# Patient Record
Sex: Male | Born: 1965 | Race: White | Hispanic: No | Marital: Married | Smoking: Never smoker
Health system: Southern US, Community
[De-identification: ages and names within clinical notes are randomized; demographics above are authoritative.]

## PROBLEM LIST (undated history)

## (undated) DIAGNOSIS — E875 Hyperkalemia: Secondary | ICD-10-CM

## (undated) DIAGNOSIS — R079 Chest pain, unspecified: Secondary | ICD-10-CM

## (undated) DIAGNOSIS — M519 Unspecified thoracic, thoracolumbar and lumbosacral intervertebral disc disorder: Secondary | ICD-10-CM

## (undated) DIAGNOSIS — I10 Essential (primary) hypertension: Secondary | ICD-10-CM

## (undated) DIAGNOSIS — I959 Hypotension, unspecified: Secondary | ICD-10-CM

## (undated) DIAGNOSIS — N289 Disorder of kidney and ureter, unspecified: Secondary | ICD-10-CM

## (undated) DIAGNOSIS — N179 Acute kidney failure, unspecified: Secondary | ICD-10-CM

## (undated) DIAGNOSIS — M5020 Other cervical disc displacement, unspecified cervical region: Secondary | ICD-10-CM

## (undated) DIAGNOSIS — E119 Type 2 diabetes mellitus without complications: Secondary | ICD-10-CM

## (undated) DIAGNOSIS — R0602 Shortness of breath: Secondary | ICD-10-CM

## (undated) DIAGNOSIS — N170 Acute kidney failure with tubular necrosis: Secondary | ICD-10-CM

## (undated) DIAGNOSIS — R000 Tachycardia, unspecified: Secondary | ICD-10-CM

## (undated) DIAGNOSIS — G4730 Sleep apnea, unspecified: Secondary | ICD-10-CM

## (undated) HISTORY — DX: Shortness of breath: R06.02

## (undated) HISTORY — PX: SHOULDER SURGERY: SHX246

## (undated) HISTORY — DX: Type 2 diabetes mellitus without complications: E11.9

## (undated) HISTORY — DX: Hypotension, unspecified: I95.9

## (undated) HISTORY — DX: Hyperkalemia: E87.5

## (undated) HISTORY — DX: Essential (primary) hypertension: I10

## (undated) HISTORY — DX: Tachycardia, unspecified: R00.0

## (undated) HISTORY — DX: Acute kidney failure with tubular necrosis: N17.0

## (undated) HISTORY — DX: Acute kidney failure, unspecified: N17.9

## (undated) HISTORY — DX: Disorder of kidney and ureter, unspecified: N28.9

## (undated) HISTORY — PX: KNEE SURGERY: SHX244

## (undated) HISTORY — DX: Chest pain, unspecified: R07.9

---

## 1997-10-29 ENCOUNTER — Emergency Department (HOSPITAL_COMMUNITY): Admission: EM | Admit: 1997-10-29 | Discharge: 1997-10-29 | Payer: Self-pay | Admitting: Emergency Medicine

## 1997-10-30 ENCOUNTER — Emergency Department (HOSPITAL_COMMUNITY): Admission: EM | Admit: 1997-10-30 | Discharge: 1997-10-30 | Payer: Self-pay | Admitting: Emergency Medicine

## 1999-11-15 ENCOUNTER — Emergency Department (HOSPITAL_COMMUNITY): Admission: EM | Admit: 1999-11-15 | Discharge: 1999-11-15 | Payer: Self-pay | Admitting: Emergency Medicine

## 1999-11-15 ENCOUNTER — Encounter: Payer: Self-pay | Admitting: Emergency Medicine

## 1999-11-19 ENCOUNTER — Encounter: Admission: RE | Admit: 1999-11-19 | Discharge: 1999-11-19 | Payer: Self-pay | Admitting: Internal Medicine

## 1999-11-29 ENCOUNTER — Encounter: Admission: RE | Admit: 1999-11-29 | Discharge: 2000-02-27 | Payer: Self-pay | Admitting: *Deleted

## 2000-01-21 ENCOUNTER — Encounter: Admission: RE | Admit: 2000-01-21 | Discharge: 2000-01-21 | Payer: Self-pay | Admitting: Internal Medicine

## 2001-02-15 ENCOUNTER — Encounter: Admission: RE | Admit: 2001-02-15 | Discharge: 2001-02-15 | Payer: Self-pay | Admitting: Family Medicine

## 2001-02-15 ENCOUNTER — Encounter: Payer: Self-pay | Admitting: Family Medicine

## 2001-02-27 ENCOUNTER — Ambulatory Visit (HOSPITAL_COMMUNITY): Admission: RE | Admit: 2001-02-27 | Discharge: 2001-02-27 | Payer: Self-pay | Admitting: Orthopedic Surgery

## 2001-02-27 ENCOUNTER — Encounter: Payer: Self-pay | Admitting: Orthopedic Surgery

## 2001-10-15 ENCOUNTER — Ambulatory Visit (HOSPITAL_COMMUNITY): Admission: RE | Admit: 2001-10-15 | Discharge: 2001-10-15 | Payer: Self-pay | Admitting: Orthopedic Surgery

## 2001-10-15 ENCOUNTER — Encounter: Payer: Self-pay | Admitting: Orthopedic Surgery

## 2001-11-29 ENCOUNTER — Observation Stay (HOSPITAL_COMMUNITY): Admission: RE | Admit: 2001-11-29 | Discharge: 2001-11-30 | Payer: Self-pay | Admitting: Orthopedic Surgery

## 2002-04-30 ENCOUNTER — Ambulatory Visit (HOSPITAL_COMMUNITY): Admission: RE | Admit: 2002-04-30 | Discharge: 2002-04-30 | Payer: Self-pay | Admitting: *Deleted

## 2002-04-30 ENCOUNTER — Encounter: Payer: Self-pay | Admitting: *Deleted

## 2003-03-07 ENCOUNTER — Ambulatory Visit (HOSPITAL_BASED_OUTPATIENT_CLINIC_OR_DEPARTMENT_OTHER): Admission: RE | Admit: 2003-03-07 | Discharge: 2003-03-07 | Payer: Self-pay | Admitting: Orthopedic Surgery

## 2003-08-29 ENCOUNTER — Encounter: Admission: RE | Admit: 2003-08-29 | Discharge: 2003-11-27 | Payer: Self-pay | Admitting: Internal Medicine

## 2004-02-25 ENCOUNTER — Encounter: Admission: RE | Admit: 2004-02-25 | Discharge: 2004-02-25 | Payer: Self-pay | Admitting: Internal Medicine

## 2005-04-20 ENCOUNTER — Encounter: Admission: RE | Admit: 2005-04-20 | Discharge: 2005-04-20 | Payer: Self-pay | Admitting: Internal Medicine

## 2005-12-12 ENCOUNTER — Encounter: Admission: RE | Admit: 2005-12-12 | Discharge: 2005-12-12 | Payer: Self-pay | Admitting: Rheumatology

## 2006-01-02 ENCOUNTER — Ambulatory Visit (HOSPITAL_BASED_OUTPATIENT_CLINIC_OR_DEPARTMENT_OTHER): Admission: RE | Admit: 2006-01-02 | Discharge: 2006-01-02 | Payer: Self-pay | Admitting: Rheumatology

## 2006-01-08 ENCOUNTER — Ambulatory Visit: Payer: Self-pay | Admitting: Internal Medicine

## 2006-06-30 ENCOUNTER — Encounter: Admission: RE | Admit: 2006-06-30 | Discharge: 2006-06-30 | Payer: Self-pay | Admitting: Internal Medicine

## 2006-12-25 ENCOUNTER — Encounter: Admission: RE | Admit: 2006-12-25 | Discharge: 2006-12-25 | Payer: Self-pay | Admitting: Internal Medicine

## 2009-03-07 ENCOUNTER — Inpatient Hospital Stay (HOSPITAL_COMMUNITY): Admission: EM | Admit: 2009-03-07 | Discharge: 2009-03-09 | Payer: Self-pay | Admitting: Emergency Medicine

## 2010-10-16 LAB — WOUND CULTURE

## 2010-10-16 LAB — BASIC METABOLIC PANEL
Calcium: 8.7 mg/dL (ref 8.4–10.5)
Creatinine, Ser: 0.63 mg/dL (ref 0.4–1.5)
GFR calc Af Amer: >60 mL/min (ref >60)
GFR calc non Af Amer: >60 mL/min (ref >60)
Glucose, Bld: 178 mg/dL — ABNORMAL HIGH (ref 70–99)
Sodium: 136 mEq/L (ref 135–145)

## 2010-10-16 LAB — COMPREHENSIVE METABOLIC PANEL
ALT: 21 U/L (ref 0–53)
AST: 19 U/L (ref 0–37)
Alkaline Phosphatase: 54 U/L (ref 39–117)
CO2: 27 mEq/L (ref 19–32)
Calcium: 8.9 mg/dL (ref 8.4–10.5)
Chloride: 105 mEq/L (ref 96–112)
GFR calc Af Amer: >60 mL/min (ref >60)
GFR calc non Af Amer: >60 mL/min (ref >60)
Glucose, Bld: 141 mg/dL — ABNORMAL HIGH (ref 70–99)
Potassium: 4.2 mEq/L (ref 3.5–5.1)
Sodium: 140 mEq/L (ref 135–145)

## 2010-10-16 LAB — GLUCOSE, CAPILLARY
Glucose-Capillary: 144 mg/dL — ABNORMAL HIGH (ref 70–99)
Glucose-Capillary: 175 mg/dL — ABNORMAL HIGH (ref 70–99)
Glucose-Capillary: 175 mg/dL — ABNORMAL HIGH (ref 70–99)
Glucose-Capillary: 192 mg/dL — ABNORMAL HIGH (ref 70–99)
Glucose-Capillary: 213 mg/dL — ABNORMAL HIGH (ref 70–99)

## 2010-10-16 LAB — PROTIME-INR: Prothrombin Time: 13.3 seconds (ref 11.6–15.2)

## 2010-10-16 LAB — CBC
Hemoglobin: 12.7 g/dL — ABNORMAL LOW (ref 13.0–17.0)
Hemoglobin: 12.9 g/dL — ABNORMAL LOW (ref 13.0–17.0)
RBC: 4.07 MIL/uL — ABNORMAL LOW (ref 4.22–5.81)
RBC: 4.21 MIL/uL — ABNORMAL LOW (ref 4.22–5.81)
RDW: 13 % (ref 11.5–15.5)
WBC: 5.8 10*3/uL (ref 4.0–10.5)

## 2010-10-16 LAB — HEMOGLOBIN A1C: Hgb A1c MFr Bld: 8.4 % — ABNORMAL HIGH (ref 4.6–6.1)

## 2010-10-16 LAB — LIPID PANEL
LDL Cholesterol: 46 mg/dL (ref 0–99)
Total CHOL/HDL Ratio: 2.4 RATIO
VLDL: 16 mg/dL (ref 0–40)

## 2010-10-16 LAB — DIFFERENTIAL
Basophils Absolute: 0 10*3/uL (ref 0.0–0.1)
Basophils Relative: 0 % (ref 0–1)
Lymphocytes Relative: 18 % (ref 12–46)
Monocytes Absolute: 0.6 10*3/uL (ref 0.1–1.0)
Neutro Abs: 6.1 10*3/uL (ref 1.7–7.7)
Neutrophils Relative %: 73 % (ref 43–77)

## 2010-10-16 LAB — APTT: aPTT: 33 seconds (ref 24–37)

## 2010-11-23 NOTE — H&P (Signed)
Scott Simmons, Scott Simmons                ACCOUNT NO.:  [id number]   MEDICAL RECORD NO.:  [id number]          PATIENT TYPE:  INP   LOCATION:  1826                         FACILITY:  [facility]   PHYSICIAN:  Michiel Cowboy, MDDATE OF BIRTH:  1966-02-19   DATE OF ADMISSION:  03/07/2009  DATE OF DISCHARGE:                              HISTORY & PHYSICAL   PRIMARY CARE PHYSICIAN:  Georgann Housekeeper, MD.   CHIEF COMPLAINT:  Swollen right arm.   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  He has a  lot of superficial skin injuries throughout his work.  Yesterday he had  a small injury to his right wrist and then noticed to have some redness  and swelling around the area.  He presented to urgent care who started  him on doxycycline and wanted him to come back again.  When he returned  today his arm seemed to be a little bit more swollen, with some more  spreading redness at which point they recommended for him to follow up  at the emergency department.  ER felt that given that this is a diabetic  with upper extremity cellulitis that admission was warranted at which  point [facility] was called for an admission.  The patient denies  any fevers or chills which he could measure but felt that maybe  yesterday he may have had some fever.  He had a little bit of a  headache.  No nausea, no vomiting, no diarrhea, no diaphoresis.  No  chest pain, no shortness of breath.   REVIEW OF SYSTEMS:  Otherwise review of systems unremarkable.   PAST MEDICAL HISTORY:  Significant for diabetes, arthritis, chronic back  pain.  His last hemoglobin A1c was 7.0.   SOCIAL HISTORY:  The patient does not smoke or drink, does not abuse  drugs.  Works as a Curator.   FAMILY HISTORY:  Significant for father with diabetes.   ALLERGIES:  None.   MEDICATIONS:  1. Oxycodone 30 mg as needed.  The patient uses it up to 4-6 times a      day.  2. Actos / metformin 850 / 50 mg twice a day.  3.  Glipizide 5 mg daily.  4. Lisinopril 2.5 daily.  5. Gemfibrozil dose unknown daily.  6. Celebrex 200 twice a day as needed.  7. Soma 350 as needed.  Usually uses it about twice a day.   PHYSICAL EXAMINATION:  VITAL SIGNS:  Temperature 98.2, blood pressure  149/90, pulse 99, respirations 27, satting 99% on room air.  GENERAL:  The patient appears to be currently in no acute distress.  HEAD:  Nontraumatic.  Moist mucous membranes.  LUNGS:  Clear to auscultation bilaterally.  HEART:  Regular rate and rhythm.  No murmurs appreciated.  ABDOMEN:  Soft, nontender, nondistended, slightly obese.  LOWER EXTREMITIES:  Without clubbing, cyanosis or edema.  NEUROLOGICAL:  The patient is grossly intact.  Upper extremity, right  upper extremity significant for small draining serosanguineous fluid /  lesion surrounded by erythema and mild induration, no fluctuance noted.   LABS:  White blood cell count 8.4, hemoglobin 12.9, sodium 136,  potassium 3.8, creatinine 0.63.   ASSESSMENT AND PLAN:  1. This is a 45 year old diabetic with upper extremity cellulitis.      Will admit for IV antibiotics.  He already has been started on      Zosyn and vanc in ED.  We will continue that.  Try to obtain wound      cultures.  Since the patient is afebrile for right now will hold      off on blood cultures but if he becomes febrile will order.  The      patient only had an infection for 2 days.  Will hold off on      radiological imaging since osteomyelitis at this point would be      extremely unlikely.  2. Diabetes.  Continue home medications.  Check hemoglobin A1c and do      sliding scale.  Hold metformin while inpatient.  3. Chronic pain.  Continue oxycodone and Soma.  4. Prophylaxis.  Protonix and Lovenox.   Dr. Georgann Housekeeper to assume care in the a.m.      Michiel Cowboy, MD  Electronically Signed     AVD/MEDQ  D:  03/07/2009  T:  03/07/2009  Job:  [id number]

## 2010-11-23 NOTE — Discharge Summary (Signed)
Scott Simmons, Scott Simmons                ACCOUNT NO.:  [id number]   MEDICAL RECORD NO.:  [id number]          PATIENT TYPE:  INP   LOCATION:  5127                         FACILITY:  [facility]   PHYSICIAN:  Georgann Housekeeper, MD      DATE OF BIRTH:  03/27/1966   DATE OF ADMISSION:  03/07/2009  DATE OF DISCHARGE:  03/09/2009                               DISCHARGE SUMMARY   DISCHARGE DIAGNOSES:  1. Right arm cellulitis.  2. History of diabetes.  3. History of chronic pain syndrome.  4. History of dyslipidemia.   MEDICATION ON DISCHARGE:  1. ACTOplus Met 850/15 twice a day.  2. Celebrex 200 mg b.i.d.  3. Lopid 600 mg daily.  4. Glipizide 5 mg b.i.d.  5. Lisinopril 2.5 mg daily.  6. Oxycodone 30 mg q.4h. p.r.n. for pain.  7. Aspirin 81 mg daily.  8. Soma 350 three times a day as needed.  9. Doxycycline 100 mg b.i.d. for 10 days.  10.Septra DS one b.i.d. for 10 days.   Home health to check on the right arm.   LABORATORY DATA:  White count of 5.8, hemoglobin 12.7.  Blood  chemistries normal.  LFTs normal.  Wound cultures done on March 07, 2009 and March 08, 2009, negative.  A1c 8.4.   Blood pressure 105/59, pulse 78, temperature 98.2.   HOSPITAL COURSE:  A 45 year old male with history of diabetes, chronic  pain and hypertension.  He had several cuts he gets on the hand from  being at work.  He got a little cut on the right arm and was seen in the  walk-in clinic on the Saturday.  His symptoms were not getting better  with p.o. antibiotic.  His redness got worse on the forearm and he had a  little bit of area of abscess on the wrist.  He was seen in the ER.  Because of diabetes, he was started on vancomycin and Zosyn.  His white  count remained normal.  He remained afebrile.  His wound culture were  negative.  He had I and D which there was not much drained.  His redness  of the arm over the next 2 days resolved.  He still had little bit of  swelling and mild discomfort.  The patient will  get p.o. antibiotic,  doxycycline and Septra and follow up with me at the office in 2-3 days.  We will have home health and to check on his wound.      Georgann Housekeeper, MD  Electronically Signed     KH/MEDQ  D:  03/09/2009  T:  03/09/2009  Job:  [id number]

## 2010-11-26 NOTE — Procedures (Signed)
NAME:  Scott Simmons, Scott Simmons                ACCOUNT NO.:  [id number]   MEDICAL RECORD NO.:  [id number]          PATIENT TYPE:  OUT   LOCATION:  SLEEP CENTER                 [facility]ACILITY[facility]   PHYSICIAN:  Clinton D. Maple Hudson, M.D. DATE OF BIRTH:  10/13/1965   DATE OF STUDY:  01/02/2006                              NOCTURNAL POLYSOMNOGRAM   REFERRING PHYSICIAN:  Dr. Stacey Drain.   INDICATIONS FOR STUDY:  Hypersomnia with sleep apnea.   EPWORTH SLEEPINESS SCORE:  08/24.   BMI:  41.6.   WEIGHT:  298 pounds.   HOME MEDICATION:  Metformin, Avandia, Mobic, Tricor, Ambien, glipizide,  Skelaxin, Hydrocodone, aspirin, Xanax.   SLEEP ARCHITECTURE:  Total sleep time 364 minutes with sleep efficiency 96%.  Stage I was 3%, stage II 82%, stages III and IV were absent, REM 15% of  total sleep time.  Sleep latency 5 minutes, REM latency 225 minutes, awake  after sleep onset 10 minutes, arousal index 18.8.   RESPIRATORY DATA:  Split study protocol.  Apnea/hypopnea index (AHI, RDI)  52.8 obstructive events per hour indicating moderately severe obstructive  sleep apnea/hypopnea syndrome before CPAP.  This included 89 obstructive  apneas, one central apnea and 34 hypopneas before CPAP.  Events for more  common while supine but did occur in other positions.  REM AHI 0. CPAP was  titrated to 13 CWP, AHI 1.7 per hour.  A medium ComfortGel mask was used  with heated humidifier.   OXYGEN DATA:  Very loud snoring with oxygen desaturation to a nadir of 75%  before CPAP.  After CPAP control saturation held 95% on room air.   CARDIAC DATA:  Normal sinus rhythm.   MOVEMENT/PARASOMNIA:  A total of 138 limb jerks were recorded of which only  five were associated with awakening or arousal for periodic limb movement  with arousal index of 0.8 per hour which is insignificant.   IMPRESSION/RECOMMENDATIONS:  1.  Moderately severe obstructive sleep apnea/hypopnea syndrome, AHI 52.8      per hour with events most  common while supine.  Very loud snoring with      oxygen desaturation to a nadir of 75%.  2.  Successful CPAP titration to 13 CWP, AHI 1.7 per hour.  A medium      ComfortGel mask was used with heated humidifier.      Clinton D. Maple Hudson, M.D.  Diplomate, Biomedical engineer of Sleep Medicine  Electronically Signed     CDY/MEDQ  D:  01/08/2006 12:55:39  T:  01/09/2006 08:47:36  Job:  [id number]

## 2010-11-26 NOTE — Op Note (Signed)
[facility]  Patient:    Scott Simmons, Scott Simmons Visit Number: [id number] MRN: [id number]          Service Type: SUR Location: 4W 0460 02 Attending Physician:  Marlowe Kays Page Dictated by:   Illene Labrador. Aplington, M.D. Proc. Date: 11/29/01 Admit Date:  11/29/2001 Discharge Date: 11/30/2001                             Operative Report  PREOPERATIVE DIAGNOSES:  1. Labral disruption.  2. 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  POSTOPERATIVE DIAGNOSES:  1. Labral disruption.  2. 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  OPERATION PERFORMED:  1. Right shoulder arthroscopy with debridement of labral disruption.  2. Arthroscopic subacromial decompression.  SURGEON:  Illene Labrador. Aplington, M.D.  ASSISTANT:  Marcie Bal. Troncale, P.A.C.  ANESTHESIA:  General.  PATHOLOGY AND JUSTIFICATION FOR PROCEDURE:  Chronic pain, right shoulder, unrelieved by steroid injection is a type 3 acromion and a MRI has some interstitial disruption of the rotator cuff with chronic subacromial pain. He also has some minor labral disruption at the attachment of the biceps tendon area. These findings were confirmed at surgery.  DESCRIPTION OF PROCEDURE:  Satisfactory general anesthesia, beach chair position on the Schlein frame, the right shoulder girdle was prepped with Duraprep and draped in a sterile field and the anatomy of the shoulder was marked out and the subacromial space, lateral portal and the posterior soft spot portals were infiltrated with 0.5% Marcaine with adrenaline into the posterior soft spot. I introduced a blunt trocar in the glenohumeral joint and on inspection the joint was normal other than significant fraying of the labrum just adjacent to the biceps tendon. Because of this, I introduced the scope into the anterior capsule. I had to go medial to the biceps tendon since I was  unable to really get through in the usual position lateral and between that the subscapularis, he is a very large man. I introduced a 4.2 shaver through the anterior portal and was able to shave down the labral disruption with the basic labral integrity, it seemed to be intact as far as attachment. After taking documentary pictures pre and post and completing this portion of the procedure, I then removed the cannula after draining the anterior joint and redirected the scope into the subacromial area. I then made a lateral portal and introduced the cannula with a 4.2 shaver. He did not have a lot of subdeltoid bursitis but visualization, a small amount was removed. I then introduced the arthrocare vaporizer and began removing the soft tissue from the underneath surface of the acromion. He had a very thick coracoacromial ligament which appeared to be play a very prominent part in his impingement process. A documented picture of the preoperative tightness was taken. I then brought in a 4-0 oval bur and began burring down the acromion until we had a wide subacromial clearance which was documented with the arm to the side and arm abducted. Some minimal bleeding was corrected before completing the surgery. The subacromial space was drained as much as it could and the three portals were closed with interrupted 4-0 nylon mattress sutures. They were then infiltrated along the subacromial space with 0.5% Marcaine with adrenaline once again. Betadine Adaptic dry sterile dressing and large shoulder immobilizer were applied. He tolerated the procedure well and at the time of  this dictation was on his way to the recovery room in satisfactory condition with no known complications. Dictated by:   Illene Labrador. Aplington, M.D. Attending Physician:  Joaquin Courts DD:  11/29/01 TD:  12/02/01 Job: [id number]

## 2010-11-26 NOTE — Op Note (Signed)
NAME:  Scott Simmons, Scott Simmons                          ACCOUNT NO.:  [id number]   MEDICAL RECORD NO.:  [id number]                   PATIENT TYPE:  AMB   LOCATION:  DSC                                  FACILITY:  [facility]   PHYSICIAN:  Georges Lynch. Darrelyn Hillock, M.D.             DATE OF BIRTH:  08-26-1965   DATE OF PROCEDURE:  03/07/2003  DATE OF DISCHARGE:                                 OPERATIVE REPORT   SURGEON:  Georges Lynch. Darrelyn Hillock, M.D.   ASSISTANT:  Nurse.   PREOPERATIVE DIAGNOSES:  1. Partial tear of the anterior cruciate ligament, left knee.  2. Chronic synovitis, left knee.  3. Degenerative arthritis, left knee.   POSTOPERATIVE DIAGNOSES:  1. Partial tear of the anterior cruciate ligament, left knee.  2. Chronic synovitis, left knee.  3. Degenerative arthritis, left knee.   OPERATION:  1. Diagnostic arthroscopy, left knee.  2. Debridement of a large flap tear of  the ACL, left knee.  3. Synovectomy, left knee.   PROCEDURE:  Under general anesthesia routine orthopedic prepping and draping  of the left lower extremity was carried out. I made a small  punctate  incision in the suprapatellar pouch area. The inflow cannula was inserted  and the knee was  distended with saline. Following  this another small  punctate incision was made in the anterolateral joint  space. The  arthroscope was entered from this approach.   I did a complete diagnostic arthroscopy. He had rather significant  synovitis. I introduced the shaver suction device through the medial  approach and did a synovectomy. I then went down and looked at the ACL and  posterior cruciate. He had about a 50% tear of the ACL. There was  a large  cystic like area embedded in the ACL from a chronic degenerated portion of  the ACL. He also had a large flap tear which was then removed with the  shaver suction device as well as a cystic area. I probed the ACL. The  remaining part of the ACL was intact.   I then went over and examined  both menisci. They were intact, but he did  have some early arthritic changes of the lateral tibial plateau.   I thoroughly irrigated out the knee. I removed all the fluid. I closed all 3  punctate incisions with 3-0 nylon suture. Sterile dressings were applied  after I injected 30 mL of 0.5% Marcaine with epinephrine into the knee  joint.   Follow up care will be on Percocet 10/650 for pain, 1 q.4h. p.r.n. for pain.  He will be on Bufferin 1 b.i.d. x2 weeks as an anticoagulant. He will be on  crutches partial to full weightbearing. I will see  him in the office in  about 2 weeks or prior to that if there is a problem.  Ronald A. Darrelyn Hillock, M.D.    RAG/MEDQ  D:  03/07/2003  T:  03/08/2003  Job:  [id number]

## 2011-03-23 ENCOUNTER — Other Ambulatory Visit: Payer: Self-pay | Admitting: Internal Medicine

## 2011-03-23 DIAGNOSIS — R1013 Epigastric pain: Secondary | ICD-10-CM

## 2011-03-25 ENCOUNTER — Ambulatory Visit
Admission: RE | Admit: 2011-03-25 | Discharge: 2011-03-25 | Disposition: A | Discharge status: Home / Self Care | Payer: Managed Care, Other (non HMO) | Source: Ambulatory Visit | Attending: Internal Medicine | Admitting: Internal Medicine

## 2011-03-25 DIAGNOSIS — R1013 Epigastric pain: Secondary | ICD-10-CM

## 2011-03-31 ENCOUNTER — Ambulatory Visit (HOSPITAL_COMMUNITY)
Admission: RE | Admit: 2011-03-31 | Discharge: 2011-03-31 | Disposition: A | Discharge status: Home / Self Care | Payer: Managed Care, Other (non HMO) | Source: Ambulatory Visit | Attending: Gastroenterology | Admitting: Gastroenterology

## 2011-03-31 ENCOUNTER — Other Ambulatory Visit: Payer: Self-pay | Admitting: Gastroenterology

## 2011-03-31 DIAGNOSIS — R933 Abnormal findings on diagnostic imaging of other parts of digestive tract: Secondary | ICD-10-CM | POA: Insufficient documentation

## 2011-03-31 DIAGNOSIS — K319 Disease of stomach and duodenum, unspecified: Secondary | ICD-10-CM | POA: Insufficient documentation

## 2011-03-31 DIAGNOSIS — R110 Nausea: Secondary | ICD-10-CM | POA: Insufficient documentation

## 2011-04-10 NOTE — Op Note (Signed)
  NAME:  KUTTER, SCHNEPF NO.:  [id number]  MEDICAL RECORD NO.:  [id number]  LOCATION:  WLEN                         FACILITY:  [facility]  PHYSICIAN:  Danise Edge, M.D.   DATE OF BIRTH:  1966/06/04  DATE OF PROCEDURE:  03/31/2011 DATE OF DISCHARGE:                              OPERATIVE REPORT   REFERRING PHYSICIAN:  Georgann Housekeeper, M.D.  PROCEDURE:  Esophagogastroduodenoscopy.  HISTORY:  Mr. Scott Simmons is a 45 year old male, born on 1965-12-10.  The patient has unexplained intermittent nausea.  He underwent a barium esophagram with upper GI x-ray series, which revealed a filling defect in the distal esophagus.  ENDOSCOPIST:  Danise Edge, M.D.  PREMEDICATION: 1. Fentanyl 100 mcg. 2. Versed 10 mg. 3. Benadryl 50 mg.  PROCEDURE IN DETAIL:  The patient was placed in the left lateral decubitus position.  The Pentax gastroscope was passed through the posterior hypopharynx into the proximal esophagus without difficulty. The hypopharynx, larynx, and vocal cords appeared normal.  Esophagoscopy:  The proximal mid and lower segments of the esophageal mucosa appeared normal.  Gastroscopy:  There is a large polypoid lesion in the gastric cardia. Retroflex view of the gastric cardia and fundus was otherwise normal. The gastric body, antrum, and pylorus appeared normal.  Duodenoscopy:  The duodenal bulb and descending duodenum appeared normal.  Gastric biopsies were performed to look for H pylori gastritis.  Due to the patient's combativeness and continued retching, I was unable to biopsy the large gastric cardia polypoid lesion.  PLAN:  I will schedule the patient for a repeat esophagogastroduodenoscopy using propofol sedation.          ______________________________ Danise Edge, M.D.     MJ/MEDQ  D:  03/31/2011  T:  04/01/2011  Job:  [id number]  Electronically Signed by Danise Edge M.D. on 04/10/2011 01:44:22 PM

## 2011-04-21 ENCOUNTER — Other Ambulatory Visit: Payer: Self-pay | Admitting: Gastroenterology

## 2011-04-21 ENCOUNTER — Ambulatory Visit (HOSPITAL_COMMUNITY)
Admission: RE | Admit: 2011-04-21 | Discharge: 2011-04-21 | Disposition: A | Discharge status: Home / Self Care | Payer: Managed Care, Other (non HMO) | Source: Ambulatory Visit | Attending: Gastroenterology | Admitting: Gastroenterology

## 2011-04-21 DIAGNOSIS — Z79899 Other long term (current) drug therapy: Secondary | ICD-10-CM | POA: Insufficient documentation

## 2011-04-21 DIAGNOSIS — D131 Benign neoplasm of stomach: Secondary | ICD-10-CM | POA: Insufficient documentation

## 2011-04-21 DIAGNOSIS — E669 Obesity, unspecified: Secondary | ICD-10-CM | POA: Insufficient documentation

## 2011-04-21 DIAGNOSIS — R110 Nausea: Secondary | ICD-10-CM | POA: Insufficient documentation

## 2011-04-21 DIAGNOSIS — I10 Essential (primary) hypertension: Secondary | ICD-10-CM | POA: Insufficient documentation

## 2011-04-21 DIAGNOSIS — K2940 Chronic atrophic gastritis without bleeding: Secondary | ICD-10-CM | POA: Insufficient documentation

## 2011-04-21 DIAGNOSIS — E119 Type 2 diabetes mellitus without complications: Secondary | ICD-10-CM | POA: Insufficient documentation

## 2011-04-21 DIAGNOSIS — E785 Hyperlipidemia, unspecified: Secondary | ICD-10-CM | POA: Insufficient documentation

## 2011-04-21 LAB — GLUCOSE, CAPILLARY: Glucose-Capillary: 194 mg/dL — ABNORMAL HIGH (ref 70–99)

## 2011-05-02 NOTE — Op Note (Signed)
  NAMEMarland Kitchen  TREVIOUS, RAMPEY NO.:  [id number]  MEDICAL RECORD NO.:  [id number]  LOCATION:  WLEN                         FACILITY:  [facility]  PHYSICIAN:  Danise Edge, M.D.   DATE OF BIRTH:  1966/01/10  DATE OF PROCEDURE:  04/21/2011 DATE OF DISCHARGE:                              OPERATIVE REPORT   PROCEDURE:  Esophagogastroduodenoscopy.  REFERRING PHYSICIAN:  Georgann Housekeeper, MD  HISTORY:  Scott Simmons is a 45 year old male born on [birth year]. To investigate the patient's intermittent nausea, he underwent a barium esophagram with upper GI x-ray series.  By x-ray, there was a filling defect in the distal esophagus with a normal-appearing stomach and duodenum.  ENDOSCOPIST:  Danise Edge, MD  PREMEDICATION:  Propofol administered by Anesthesia.  PROCEDURE IN DETAIL:  The patient was placed in the left lateral decubitus position.  The Pentax gastroscope was passed through the posterior hypopharynx into the proximal esophagus without difficulty. The vocal cords were not visualized.  Esophagoscopy:  The proximal, mid, and lower segments of the esophageal mucosa appeared normal.  The squamocolumnar junction is noted at approximately 40 cm from the incisor teeth.  There was no endoscopic evidence for the presence of erosive esophagitis or Barrett esophagus.  Gastroscopy:  Retroflexed view of the cardia revealed a 1 cm polypoid- appearing lesion which may simply represent a prominent gastric fold. Biopsies were performed from the gastric, cardia, and a retroflexed view from the stomach and also a direct view from the esophagus.  The gastric fundus, body, antrum, and pylorus appeared normal.  Biopsies were taken from the gastric antrum, gastric body, and fundus to rule out H. pylori gastritis.  Duodenoscopy:  The duodenal bulb and descending duodenum appeared normal.  ASSESSMENT:  A 1 cm polypoid-appearing lesion in the gastric cardia was biopsied, but  may simply represent a prominent gastric fold.  Otherwise, normal esophagogastroduodenoscopy.          ______________________________ Danise Edge, M.D.     MJ/MEDQ  D:  04/21/2011  T:  04/21/2011  Job:  [id number]  cc:   Georgann Housekeeper, MD Fax: [phone]  Electronically Signed by Danise Edge M.D. on 05/02/2011 02:05:28 PM

## 2011-05-16 ENCOUNTER — Other Ambulatory Visit: Payer: Self-pay | Admitting: Internal Medicine

## 2011-05-16 DIAGNOSIS — R1013 Epigastric pain: Secondary | ICD-10-CM

## 2011-05-16 DIAGNOSIS — R110 Nausea: Secondary | ICD-10-CM

## 2011-05-17 ENCOUNTER — Ambulatory Visit
Admission: RE | Admit: 2011-05-17 | Discharge: 2011-05-17 | Disposition: A | Discharge status: Home / Self Care | Payer: Managed Care, Other (non HMO) | Source: Ambulatory Visit | Attending: Internal Medicine | Admitting: Internal Medicine

## 2011-05-17 DIAGNOSIS — R110 Nausea: Secondary | ICD-10-CM

## 2011-05-17 DIAGNOSIS — R1013 Epigastric pain: Secondary | ICD-10-CM

## 2013-08-26 ENCOUNTER — Other Ambulatory Visit: Payer: Self-pay | Admitting: Internal Medicine

## 2013-08-26 DIAGNOSIS — R519 Headache, unspecified: Secondary | ICD-10-CM

## 2013-08-26 DIAGNOSIS — R51 Headache: Principal | ICD-10-CM

## 2013-09-01 ENCOUNTER — Ambulatory Visit
Admission: RE | Admit: 2013-09-01 | Discharge: 2013-09-01 | Disposition: A | Discharge status: Home / Self Care | Payer: Managed Care, Other (non HMO) | Source: Ambulatory Visit | Attending: Internal Medicine | Admitting: Internal Medicine

## 2013-09-01 DIAGNOSIS — R51 Headache: Principal | ICD-10-CM

## 2013-09-01 DIAGNOSIS — R519 Headache, unspecified: Secondary | ICD-10-CM

## 2013-09-01 MED ORDER — GADOBENATE DIMEGLUMINE 529 MG/ML IV SOLN
10.0000 mL | Freq: Once | INTRAVENOUS | Status: AC | PRN
  Administered 2013-09-01: 10 mL via INTRAVENOUS

## 2014-03-19 ENCOUNTER — Emergency Department (HOSPITAL_COMMUNITY): Payer: Managed Care, Other (non HMO)

## 2014-03-19 ENCOUNTER — Emergency Department (HOSPITAL_COMMUNITY)
Admission: EM | Admit: 2014-03-19 | Discharge: 2014-03-19 | Disposition: A | Discharge status: Home / Self Care | Payer: Managed Care, Other (non HMO) | Attending: Emergency Medicine | Admitting: Emergency Medicine

## 2014-03-19 ENCOUNTER — Ambulatory Visit (INDEPENDENT_AMBULATORY_CARE_PROVIDER_SITE_OTHER): Payer: Managed Care, Other (non HMO) | Admitting: Cardiovascular Disease

## 2014-03-19 ENCOUNTER — Encounter: Payer: Self-pay | Admitting: Cardiovascular Disease

## 2014-03-19 ENCOUNTER — Encounter (HOSPITAL_COMMUNITY): Payer: Self-pay | Admitting: Emergency Medicine

## 2014-03-19 VITALS — BP 128/60 | HR 112 | Ht 70.5 in | Wt 253.0 lb

## 2014-03-19 DIAGNOSIS — E119 Type 2 diabetes mellitus without complications: Secondary | ICD-10-CM | POA: Diagnosis not present

## 2014-03-19 DIAGNOSIS — Z79899 Other long term (current) drug therapy: Secondary | ICD-10-CM | POA: Diagnosis not present

## 2014-03-19 DIAGNOSIS — Z791 Long term (current) use of non-steroidal anti-inflammatories (NSAID): Secondary | ICD-10-CM | POA: Insufficient documentation

## 2014-03-19 DIAGNOSIS — Z7982 Long term (current) use of aspirin: Secondary | ICD-10-CM | POA: Diagnosis not present

## 2014-03-19 DIAGNOSIS — I10 Essential (primary) hypertension: Secondary | ICD-10-CM

## 2014-03-19 DIAGNOSIS — R079 Chest pain, unspecified: Secondary | ICD-10-CM | POA: Insufficient documentation

## 2014-03-19 DIAGNOSIS — R072 Precordial pain: Secondary | ICD-10-CM

## 2014-03-19 DIAGNOSIS — R0602 Shortness of breath: Secondary | ICD-10-CM

## 2014-03-19 HISTORY — DX: Chest pain, unspecified: R07.9

## 2014-03-19 HISTORY — DX: Essential (primary) hypertension: I10

## 2014-03-19 LAB — BASIC METABOLIC PANEL
ANION GAP: 14 (ref 5–15)
BUN: 23 mg/dL (ref 6–23)
CALCIUM: 9.6 mg/dL (ref 8.4–10.5)
CO2: 27 mEq/L (ref 19–32)
CREATININE: 0.65 mg/dL (ref 0.50–1.35)
Chloride: 97 mEq/L (ref 96–112)
GFR calc non Af Amer: >90 mL/min (ref >90)
Glucose, Bld: 198 mg/dL — ABNORMAL HIGH (ref 70–99)
Potassium: 4.6 mEq/L (ref 3.7–5.3)
Sodium: 138 mEq/L (ref 137–147)

## 2014-03-19 LAB — CBC
HCT: 44.2 % (ref 39.0–52.0)
Hemoglobin: 15.9 g/dL (ref 13.0–17.0)
MCH: 30.2 pg (ref 26.0–34.0)
MCHC: 36 g/dL (ref 30.0–36.0)
MCV: 83.9 fL (ref 78.0–100.0)
PLATELETS: 347 10*3/uL (ref 150–400)
RBC: 5.27 MIL/uL (ref 4.22–5.81)
RDW: 12 % (ref 11.5–15.5)
WBC: 8.3 10*3/uL (ref 4.0–10.5)

## 2014-03-19 LAB — I-STAT TROPONIN, ED
TROPONIN I, POC: 0 ng/mL (ref 0.00–0.08)
Troponin i, poc: 0 ng/mL (ref 0.00–0.08)

## 2014-03-19 MED ORDER — PANTOPRAZOLE SODIUM 20 MG PO TBEC: 20.0000 mg | DELAYED_RELEASE_TABLET | Freq: Every day | ORAL | Status: DC

## 2014-03-19 MED ORDER — MORPHINE SULFATE 4 MG/ML IJ SOLN
4.0000 mg | Freq: Once | INTRAMUSCULAR | Status: AC
  Administered 2014-03-19: 4 mg via INTRAVENOUS
  Filled 2014-03-19: qty 1

## 2014-03-19 MED ORDER — IOHEXOL 350 MG/ML SOLN
80.0000 mL | Freq: Once | INTRAVENOUS | Status: AC | PRN
  Administered 2014-03-19: 64 mL via INTRAVENOUS

## 2014-03-19 MED ORDER — ONDANSETRON HCL 4 MG/2ML IJ SOLN
4.0000 mg | Freq: Once | INTRAMUSCULAR | Status: AC
  Administered 2014-03-19: 4 mg via INTRAVENOUS
  Filled 2014-03-19: qty 2

## 2014-03-19 NOTE — Discharge Instructions (Signed)

## 2014-03-19 NOTE — Patient Instructions (Signed)
Dr Gwenlyn Found has requested that you have a lexiscan myoview. For further information please visit [email or web address]. Please follow instruction sheet, as given.  Your physician has requested that you have an echocardiogram. Echocardiography is a painless test that uses sound waves to create images of your heart. It provides your doctor with information about the size and shape of your heart and how well your heart's chambers and valves are working. This procedure takes approximately one hour. There are no restrictions for this procedure.  Your physician recommends that you schedule a follow-up appointment after your testing has been completed.

## 2014-03-19 NOTE — ED Notes (Signed)
Pt c/o mid sternal CP and SOB x 2 weeks worse today with diaphoresis and N/V

## 2014-03-19 NOTE — Progress Notes (Signed)
03/19/2014 Scott Simmons   September 16, 1965  [id number]  Primary Physician Wenda Low, MD Primary Cardiologist: Lorretta Harp MD Renae Gloss   HPI:  To Scott Simmons a pleasant 48 year old mildly overweight married Caucasian male father of 3 children, one grandchild referred by Dr. Deforest Hoyles at [place] for cardiovascular VAT dilation because of new-onset chest pain and tachycardia. 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 His crit was profile is remarkable for diabetes and hypertension. He probably had a cardiac catheterization performed 10 years ago that was unrevealing done because of chest pain. He is noted exertional chest pressure and and tachypalpitations of the last several weeks.   Current Outpatient Prescriptions  Medication Sig Dispense Refill  . aspirin 81 MG tablet Take 81 mg by mouth daily.      . celecoxib (CELEBREX) 200 MG capsule Take 1 capsule by mouth daily.      Marland Kitchen glipiZIDE (GLUCOTROL) 5 MG tablet Take 1 tablet by mouth 2 (two) times daily.      Marland Kitchen ibuprofen (ADVIL,MOTRIN) 800 MG tablet Take 800 mg by mouth every 8 (eight) hours as needed.      Marland Kitchen lisinopril (PRINIVIL,ZESTRIL) 10 MG tablet Take 1 tablet by mouth daily.      . Multiple Vitamin (MULTIVITAMIN) capsule Take 1 capsule by mouth daily.      . Omega-3 Fatty Acids (FISH OIL PO) Take 3 capsules by mouth daily.      . pioglitazone-metformin (ACTOPLUS MET) 15-850 MG per tablet Take 1 tablet by mouth 2 (two) times daily.      . sertraline (ZOLOFT) 50 MG tablet Take 1 tablet by mouth daily.      . SUBOXONE 8-2 MG FILM Take 1 tablet by mouth 3 (three) times daily.      Marland Kitchen testosterone cypionate (DEPOTESTOTERONE CYPIONATE) 200 MG/ML injection Inject into the muscle every 14 (fourteen) days. 1 mL      . tiZANidine (ZANAFLEX) 4 MG capsule as needed.      Marland Kitchen VIAGRA 100 MG tablet as needed.      Marland Kitchen VICTOZA 18 MG/3ML SOPN Take 1 each by mouth daily.       No current facility-administered medications for this  visit.    No Known Allergies  History   Social History  . Marital Status: Married    Spouse Name: N/A    Number of Children: N/A  . Years of Education: N/A   Occupational History  . Not on file.   Social History Main Topics  . Smoking status: Never Smoker   . Smokeless tobacco: Never Used  . Alcohol Use: No  . Drug Use: No  . Sexual Activity: Not on file   Other Topics Concern  . Not on file   Social History Narrative  . No narrative on file     Review of Systems: General: negative for chills, fever, night sweats or weight changes.  Cardiovascular: negative for chest pain, dyspnea on exertion, edema, orthopnea, palpitations, paroxysmal nocturnal dyspnea or shortness of breath Dermatological: negative for rash Respiratory: negative for cough or wheezing Urologic: negative for hematuria Abdominal: negative for nausea, vomiting, diarrhea, bright red blood per rectum, melena, or hematemesis Neurologic: negative for visual changes, syncope, or dizziness All other systems reviewed and are otherwise negative except as noted above.    Blood pressure 128/60, pulse 112, height 5' 10.5" (1.791 m), weight 253 lb (114.76 kg).  General appearance: alert and no distress Neck: no adenopathy, no carotid bruit, no  JVD, supple, symmetrical, trachea midline and thyroid not enlarged, symmetric, no tenderness/mass/nodules Lungs: clear to auscultation bilaterally Heart: S4 present and Baseline tachycardia Extremities: extremities normal, atraumatic, no cyanosis or edema and plus pedal pulses bilaterally  EKG sinus tachycardia with ventricular hypertrophy and nonspecific ST and T wave changes.  ASSESSMENT AND PLAN:   Essential hypertension Controlled on current medications  Chest pain [name] complains of exertional chest pain/chest heaviness with tachycardia over the last several weeks. He does have a history of hypertension and diabetes. Because his heart rate is already in the  120 range I do not think that exercise testing would be illuminating especially since he has left ventricular hypertrophy and baseline ST and T wave changes. Based on his abdomen to get it from a cardiac Myoview stress test and 2-D echocardiogram, and we'll see him back in the office after that      Lorretta Harp MD [facility], [facility] 03/19/2014 11:26 AM

## 2014-03-19 NOTE — Assessment & Plan Note (Signed)
Controlled on current medications 

## 2014-03-19 NOTE — Assessment & Plan Note (Signed)
Scott Simmons complains of exertional chest pain/chest heaviness with tachycardia over the last several weeks. He does have a history of hypertension and diabetes. Because his heart rate is already in the 120 range I do not think that exercise testing would be illuminating especially since he has left ventricular hypertrophy and baseline ST and T wave changes. Based on his abdomen to get it from a cardiac Myoview stress test and 2-D echocardiogram, and we'll see him back in the office after that

## 2014-03-19 NOTE — ED Notes (Signed)
Pt given bag lunch to eat.

## 2014-03-19 NOTE — ED Provider Notes (Signed)
CSN: [id number]     Arrival date & time 03/19/14  1544 History   First MD Initiated Contact with Patient 03/19/14 1706     Chief Complaint  Patient presents with  . Chest Pain     (Consider location/radiation/quality/duration/timing/severity/associated sxs/prior Treatment) HPI This patient has had chest pain for a couple weeks duration. It has been tightness in quality but here recently more central. He is noting as he worse with exertion and has been increasing shortness of breath as well. The patient has not had associated fever or cough. He has not noted to be particularly positional. The patient had an evaluation with cardiology today. He reports an EKG was done and he has further testing and they have set up for him. The patient worse today after his office appointment his chest pain however seemed to get worse. Thus he came to the emergency department. Past Medical History  Diagnosis Date  . Diabetes   . Hypertension   . Chest pain    Past Surgical History  Procedure Laterality Date  . Shoulder surgery Left   . Knee surgery Right    Family History  Problem Relation Age of Onset  . Stroke Paternal Grandmother   . Diabetes Paternal Grandmother   . Lung cancer Paternal Grandfather   . Stroke Paternal Grandfather   . Diabetes Paternal Grandfather   . Thyroid cancer Brother   . Heart attack Brother   . Mental retardation Other     half-brother x2   History  Substance Use Topics  . Smoking status: Never Smoker   . Smokeless tobacco: Never Used  . Alcohol Use: No    Review of Systems 10 Systems reviewed and are negative for acute change except as noted in the HPI.    Allergies  Review of patient's allergies indicates no known allergies.  Home Medications   Prior to Admission medications   Medication Sig Start Date End Date Taking? Authorizing Provider  aspirin EC 81 MG tablet Take 81 mg by mouth daily.   Yes Historical Provider, MD  celecoxib (CELEBREX) 200 MG  capsule Take 200 mg by mouth daily.  02/27/14  Yes Historical Provider, MD  glipiZIDE (GLUCOTROL) 5 MG tablet Take 5 mg by mouth 2 (two) times daily.  02/01/14  Yes Historical Provider, MD  ibuprofen (ADVIL,MOTRIN) 800 MG tablet Take 800 mg by mouth every 8 (eight) hours as needed for moderate pain.    Yes Historical Provider, MD  lisinopril (PRINIVIL,ZESTRIL) 10 MG tablet Take 10 mg by mouth daily.  03/11/14  Yes Historical Provider, MD  Multiple Vitamin (MULTIVITAMIN) capsule Take 1 capsule by mouth daily.   Yes Historical Provider, MD  Omega-3 Fatty Acids (FISH OIL PO) Take 1 capsule by mouth 3 (three) times daily.    Yes Historical Provider, MD  pioglitazone-metformin (ACTOPLUS MET) 15-850 MG per tablet Take 1 tablet by mouth 2 (two) times daily. 02/27/14  Yes Historical Provider, MD  sertraline (ZOLOFT) 50 MG tablet Take 50 mg by mouth daily.  03/11/14  Yes Historical Provider, MD  SUBOXONE 8-2 MG FILM Take 1 Film by mouth 3 (three) times daily.  03/03/14  Yes Historical Provider, MD  testosterone cypionate (DEPOTESTOTERONE CYPIONATE) 200 MG/ML injection Inject 200 mg into the muscle every 14 (fourteen) days. 1 mL   Yes Historical Provider, MD  tiZANidine (ZANAFLEX) 4 MG capsule Take 4 mg by mouth 3 (three) times daily as needed for muscle spasms.  12/23/13  Yes Historical Provider, MD  VIAGRA 100  MG tablet Take 100 mg by mouth as needed for erectile dysfunction.  12/27/13  Yes Historical Provider, MD  VICTOZA 18 MG/3ML SOPN Take 18 mg by mouth daily.  12/27/13  Yes Historical Provider, MD  pantoprazole (PROTONIX) 20 MG tablet Take 1 tablet (20 mg total) by mouth daily. 03/19/14   Charlesetta Shanks, MD   BP 131/68  Pulse 110  Temp(Src) 97.5 F (36.4 C) (Oral)  Resp 14  SpO2 98% Physical Exam  Constitutional: He is oriented to person, place, and time. He appears well-developed and well-nourished.  HENT:  Head: Normocephalic and atraumatic.  Eyes: EOM are normal. Pupils are equal, round, and reactive to  light.  Neck: Neck supple.  Cardiovascular: Normal rate, regular rhythm, normal heart sounds and intact distal pulses.   Pulmonary/Chest: Effort normal and breath sounds normal.  Abdominal: Soft. Bowel sounds are normal. He exhibits no distension. There is no tenderness.  Musculoskeletal: Normal range of motion. He exhibits no edema.  Neurological: He is alert and oriented to person, place, and time. He has normal strength. Coordination normal. GCS eye subscore is 4. GCS verbal subscore is 5. GCS motor subscore is 6.  Skin: Skin is warm, dry and intact.  Psychiatric: He has a normal mood and affect.     ED Course  Procedures (including critical care time) Labs Review Labs Reviewed  BASIC METABOLIC PANEL - Abnormal; Notable for the following:    Glucose, Bld 198 (*)    All other components within normal limits  CBC  I-STAT TROPOININ, ED  Randolm Idol, ED    Imaging Review Dg Chest 2 View  03/19/2014   CLINICAL DATA:  Midsternal chest pain with that episode of tachycardia.  EXAM: CHEST  2 VIEW  COMPARISON:  04/20/2005  FINDINGS: The cardiomediastinal silhouette is within normal limits. The lungs are well inflated and clear. There is no evidence of pleural effusion or pneumothorax. No acute osseous abnormality is identified.  IMPRESSION: No active cardiopulmonary disease.   Electronically Signed   By: Logan Bores   On: 03/19/2014 16:43   Ct Angio Chest Pe W/cm &/or Wo Cm  03/19/2014   CLINICAL DATA:  Midsternal chest pain and shortness of breath for 2 weeks. Diaphoresis and nausea and vomiting.  EXAM: CT ANGIOGRAPHY CHEST WITH CONTRAST  TECHNIQUE: Multidetector CT imaging of the chest was performed using the standard protocol during bolus administration of intravenous contrast. Multiplanar CT image reconstructions and MIPs were obtained to evaluate the vascular anatomy.  CONTRAST:  53m OMNIPAQUE IOHEXOL 350 MG/ML SOLN  COMPARISON:  None.  FINDINGS: Technically adequate study with  moderately good opacification of the central and proximal segmental pulmonary arteries. No focal filling defects demonstrated. No significant central pulmonary embolus.  Normal heart size. Normal caliber thoracic aorta. No evidence of aortic dissection. Great vessel origins are patent. No significant lymphadenopathy in the chest. Esophagus is decompressed.  Motion artifact limits evaluation of the lungs. No gross airspace disease or consolidation is appreciated. No pneumothorax. No pleural effusion. Airways appear patent.  Included portions of the upper abdominal organs are grossly unremarkable. Degenerative changes in the spine. No displaced fractures identified.  Review of the MIP images confirms the above findings.  IMPRESSION: No evidence of significant pulmonary embolus.   Electronically Signed   By: WLucienne CapersM.D.   On: 03/19/2014 22:11     EKG Interpretation None      MDM   Final diagnoses:  Chest pain, unspecified chest pain type   At  this point time diagnostic workup has been negative. CT of the chest does not show any acute pulmonary or other apparent vascular disease. Troponin is negative. Currently this does not appear to be an acute ischemic picture. Pain has been for a month to several weeks. At this point I do feel the patient is safe for discharge and continued management with cardiology and testing as indicated. He is advised to return should his symptoms change or worsen or new symptoms were to develop. I will suggest the patient empirically start a PPI.    Charlesetta Shanks, MD 03/19/14 [phone]

## 2014-03-19 NOTE — ED Notes (Signed)
Patient discharged with all personal belongings. 

## 2014-03-28 ENCOUNTER — Telehealth (HOSPITAL_COMMUNITY): Payer: Self-pay

## 2014-03-28 NOTE — Telephone Encounter (Signed)
Encounter complete. 

## 2014-04-01 ENCOUNTER — Ambulatory Visit: Payer: Managed Care, Other (non HMO) | Admitting: Cardiology

## 2014-04-02 ENCOUNTER — Ambulatory Visit (HOSPITAL_COMMUNITY)
Admission: RE | Admit: 2014-04-02 | Discharge: 2014-04-02 | Disposition: A | Discharge status: Home / Self Care | Payer: Managed Care, Other (non HMO) | Source: Ambulatory Visit | Attending: Cardiology | Admitting: Cardiology

## 2014-04-02 ENCOUNTER — Ambulatory Visit (HOSPITAL_COMMUNITY): Payer: Managed Care, Other (non HMO)

## 2014-04-02 ENCOUNTER — Encounter (HOSPITAL_COMMUNITY): Payer: Managed Care, Other (non HMO)

## 2014-04-02 DIAGNOSIS — R9431 Abnormal electrocardiogram [ECG] [EKG]: Secondary | ICD-10-CM | POA: Insufficient documentation

## 2014-04-02 DIAGNOSIS — R072 Precordial pain: Secondary | ICD-10-CM | POA: Diagnosis present

## 2014-04-02 DIAGNOSIS — E119 Type 2 diabetes mellitus without complications: Secondary | ICD-10-CM | POA: Diagnosis not present

## 2014-04-02 DIAGNOSIS — I10 Essential (primary) hypertension: Secondary | ICD-10-CM | POA: Insufficient documentation

## 2014-04-02 DIAGNOSIS — E669 Obesity, unspecified: Secondary | ICD-10-CM | POA: Diagnosis not present

## 2014-04-02 DIAGNOSIS — R000 Tachycardia, unspecified: Secondary | ICD-10-CM | POA: Diagnosis not present

## 2014-04-02 DIAGNOSIS — R0602 Shortness of breath: Secondary | ICD-10-CM

## 2014-04-02 DIAGNOSIS — I517 Cardiomegaly: Secondary | ICD-10-CM | POA: Diagnosis not present

## 2014-04-02 DIAGNOSIS — R011 Cardiac murmur, unspecified: Secondary | ICD-10-CM | POA: Insufficient documentation

## 2014-04-02 DIAGNOSIS — R079 Chest pain, unspecified: Secondary | ICD-10-CM

## 2014-04-02 DIAGNOSIS — R0989 Other specified symptoms and signs involving the circulatory and respiratory systems: Secondary | ICD-10-CM | POA: Insufficient documentation

## 2014-04-02 DIAGNOSIS — R0609 Other forms of dyspnea: Secondary | ICD-10-CM | POA: Insufficient documentation

## 2014-04-02 MED ORDER — REGADENOSON 0.4 MG/5ML IV SOLN
0.4000 mg | Freq: Once | INTRAVENOUS | Status: AC
  Administered 2014-04-02: 0.4 mg via INTRAVENOUS

## 2014-04-02 MED ORDER — TECHNETIUM TC 99M SESTAMIBI GENERIC - CARDIOLITE
10.0000 | Freq: Once | INTRAVENOUS | Status: AC | PRN
  Administered 2014-04-02: 10 via INTRAVENOUS

## 2014-04-02 MED ORDER — AMINOPHYLLINE 25 MG/ML IV SOLN
200.0000 mg | Freq: Once | INTRAVENOUS | Status: AC
  Administered 2014-04-02: 200 mg via INTRAVENOUS

## 2014-04-02 MED ORDER — TECHNETIUM TC 99M SESTAMIBI GENERIC - CARDIOLITE
30.0000 | Freq: Once | INTRAVENOUS | Status: AC | PRN
  Administered 2014-04-02: 30 via INTRAVENOUS

## 2014-04-02 NOTE — Progress Notes (Signed)
2D Echo Performed 04/02/2014    Marygrace Drought, RCS

## 2014-04-02 NOTE — Procedures (Addendum)
[facility] [address] [place] [address] [phone]  Cardiology Nuclear Med Study  Scott Simmons is a 48 y.o. male     MRN : [id number]     DOB: 02/02/1966  Procedure Date: 04/02/2014  Nuclear Med Background Indication for Stress Test:  Evaluation for Ischemia, [facility] and Abnormal EKG History:  Tachypalpitations, no prior nuclear MPI Cardiac Risk Factors: Hypertension, Obesity and diabetes  Symptoms: Chest pressure, DOE, Palpitations   Nuclear Pre-Procedure Caffeine/Decaff Intake:  8:30pm NPO After: 6:30am   IV Site: R Antecubital  IV 0.9% NS with Angio Cath:  22g  Chest Size (in):  XL IV Started by: Otho Perl, CNMT  Height: 5\' 11"  (1.803 m)  Cup Size: n/a  BMI:  Body mass index is 35.3 kg/(m^2). Weight:  253 lb (114.76 kg)   Tech Comments:  n/a    Nuclear Med Study 1 or 2 day study: 1 day  Stress Test Type:  [place] Provider:  Quay Burow, MD   Resting Radionuclide: Technetium 43m Sestamibi  Resting Radionuclide Dose: 10.8 mCi   Stress Radionuclide:  Technetium 43m Sestamibi  Stress Radionuclide Dose: 30.8 mCi           Stress Protocol Rest HR: 97 Stress HR: 141  Rest BP: 149/60 Stress BP: 209/90  Exercise Time (min): n/a METS: n/a          Dose of Adenosine (mg):  n/a Dose of Lexiscan: 0.4 mg  Dose of Atropine (mg): n/a Dose of Dobutamine: n/a mcg/kg/min (at max HR)  Stress Test Technologist: Mellody Memos, CCT Nuclear Technologist: Otho Perl, CNMT   Rest Procedure:  Myocardial perfusion imaging was performed at rest 45 minutes following the intravenous administration of Technetium 5m Sestamibi. Stress Procedure:  The patient received IV Lexiscan 0.4 mg over 15-seconds.  Technetium 71m Sestamibi injected IV at 30-seconds.  Patient experienced shortness of breath, chest tightness, nausea and vomiting. He was administered 200 mg of Aminophylline IV. There were  significant changes with Lexiscan.  Quantitative spect images were obtained after a 45 minute delay.  Transient Ischemic Dilatation (Normal <1.22):  1.13 QGS EDV:  125 ml QGS ESV:  50 ml LV Ejection Fraction: 60%    Rest ECG: NSR with non-specific ST-T wave changes  Stress ECG: No new significant change from baseline ECG; slight improvement.  QPS Raw Data Images:  Normal; no motion artifact; normal heart/lung ratio. Stress Images:  Normal homogeneous uptake in all areas of the myocardium. Rest Images:  Normal homogeneous uptake in all areas of the myocardium. Subtraction (SDS):  Normal  Impression Exercise Capacity:  Lexiscan with no exercise. BP Response:  Hypertensive blood pressure response. Clinical Symptoms:  Mild chest discomfort and dyspnea. ECG Impression:  No significant ST segment change suggestive of ischemia. Comparison with Prior Nuclear Study: No images to compare  Overall Impression:  Normal stress nuclear study.  LV Wall Motion:  NL LV Function EF 60%; NL Wall Motion   [name], MD  04/02/2014 12:38 PM

## 2014-04-03 ENCOUNTER — Encounter: Payer: Self-pay | Admitting: *Deleted

## 2014-04-07 ENCOUNTER — Telehealth: Payer: Self-pay | Admitting: Cardiovascular Disease

## 2014-04-07 NOTE — Telephone Encounter (Signed)
Pt called in stating that he a Echo done on 9/23 and would like to know what his results were. Please call  Thanks

## 2014-04-08 ENCOUNTER — Telehealth: Payer: Self-pay | Admitting: Cardiovascular Disease

## 2014-04-08 NOTE — Telephone Encounter (Signed)
Letter mailed out to pt

## 2014-04-08 NOTE — Telephone Encounter (Signed)
Spoke with pt, of echo and myoview results

## 2014-04-08 NOTE — Telephone Encounter (Signed)
Pt wants to know if his echo and stress test results are ready? After 2:30 please call him at [phone].

## 2014-05-08 ENCOUNTER — Ambulatory Visit: Payer: Managed Care, Other (non HMO) | Admitting: Cardiovascular Disease

## 2014-06-27 ENCOUNTER — Ambulatory Visit
Admission: RE | Admit: 2014-06-27 | Discharge: 2014-06-27 | Disposition: A | Discharge status: Home / Self Care | Payer: Managed Care, Other (non HMO) | Source: Ambulatory Visit | Attending: Nurse Practitioner | Admitting: Nurse Practitioner

## 2014-06-27 ENCOUNTER — Other Ambulatory Visit: Payer: Self-pay | Admitting: Nurse Practitioner

## 2014-06-27 DIAGNOSIS — J069 Acute upper respiratory infection, unspecified: Secondary | ICD-10-CM

## 2014-07-15 ENCOUNTER — Ambulatory Visit: Payer: Managed Care, Other (non HMO) | Admitting: Cardiovascular Disease

## 2014-07-18 ENCOUNTER — Ambulatory Visit (INDEPENDENT_AMBULATORY_CARE_PROVIDER_SITE_OTHER): Payer: Managed Care, Other (non HMO) | Admitting: Cardiology

## 2014-07-18 ENCOUNTER — Telehealth: Payer: Self-pay | Admitting: Cardiology

## 2014-07-18 ENCOUNTER — Encounter: Payer: Self-pay | Admitting: Cardiology

## 2014-07-18 VITALS — BP 152/80 | HR 114 | Ht 70.0 in | Wt 248.9 lb

## 2014-07-18 DIAGNOSIS — R000 Tachycardia, unspecified: Secondary | ICD-10-CM | POA: Insufficient documentation

## 2014-07-18 DIAGNOSIS — E119 Type 2 diabetes mellitus without complications: Secondary | ICD-10-CM

## 2014-07-18 DIAGNOSIS — R079 Chest pain, unspecified: Secondary | ICD-10-CM

## 2014-07-18 DIAGNOSIS — I10 Essential (primary) hypertension: Secondary | ICD-10-CM

## 2014-07-18 HISTORY — DX: Type 2 diabetes mellitus without complications: E11.9

## 2014-07-18 HISTORY — DX: Tachycardia, unspecified: R00.0

## 2014-07-18 LAB — T3, FREE: T3, Free: 10.3 pg/mL — ABNORMAL HIGH (ref 2.3–4.2)

## 2014-07-18 LAB — BASIC METABOLIC PANEL
BUN: 19 mg/dL (ref 6–23)
CO2: 24 mEq/L (ref 19–32)
Calcium: 9.4 mg/dL (ref 8.4–10.5)
Chloride: 106 mEq/L (ref 96–112)
Creat: 0.64 mg/dL (ref 0.50–1.35)
Glucose, Bld: 158 mg/dL — ABNORMAL HIGH (ref 70–99)
Potassium: 4 mEq/L (ref 3.5–5.3)
Sodium: 139 mEq/L (ref 135–145)

## 2014-07-18 LAB — CBC
HCT: 39 % (ref 39.0–52.0)
Hemoglobin: 13.4 g/dL (ref 13.0–17.0)
MCH: 28.8 pg (ref 26.0–34.0)
MCHC: 34.4 g/dL (ref 30.0–36.0)
MCV: 83.9 fL (ref 78.0–100.0)
MPV: 11 fL (ref 8.6–12.4)
Platelets: 232 10*3/uL (ref 150–400)
RBC: 4.65 MIL/uL (ref 4.22–5.81)
RDW: 13.3 % (ref 11.5–15.5)
WBC: 5.3 10*3/uL (ref 4.0–10.5)

## 2014-07-18 LAB — T4, FREE: Free T4: 2.97 ng/dL — ABNORMAL HIGH (ref 0.80–1.80)

## 2014-07-18 LAB — TSH: TSH: <0.008 u[IU]/mL — ABNORMAL LOW (ref 0.350–4.500)

## 2014-07-18 MED ORDER — METOPROLOL TARTRATE 25 MG PO TABS: 25.0000 mg | ORAL_TABLET | Freq: Two times a day (BID) | ORAL | Status: DC

## 2014-07-18 NOTE — Assessment & Plan Note (Signed)
Uncontrolled 

## 2014-07-18 NOTE — Telephone Encounter (Signed)
Pt called in stating that his prescriptions were called in to the wrong pharmacy. He now uses [facility] on [address]

## 2014-07-18 NOTE — Assessment & Plan Note (Signed)
R/O hyperthyroid

## 2014-07-18 NOTE — Patient Instructions (Signed)
Your physician has recommended you make the following change in your medication: start new prescription for lopressor 25 mg. This has already been sent to your pharmacy.  Your physician recommends that you return for lab work in: today.  Your physician recommends that you schedule a follow-up appointment in: 3-4 weeks with [facility].

## 2014-07-18 NOTE — Telephone Encounter (Signed)
Spoke with pt, he said [facility] had sent the scripts to [facility]. He did not need anything at this time but will call back if something else is needed.

## 2014-07-18 NOTE — Progress Notes (Signed)
07/18/2014 Scott Simmons   1966/03/13  [id number]  Primary Physician Wenda Low, MD Primary Cardiologist: Dr Gwenlyn Found   HPI:  49 y/o male, works as a Dealer, saw Dr Gwenlyn Found in Sept 2015 for HTN, chest pain, and tachycardia. Echo showed NL LVF with mild LVH, Myoview was low risk, and CTA was negative for PE. He missed a f/u appointment but is back today. He continues to have tachycardia and fatigue. He also reports 20 lb wgt loss over the last few months. He has occasional tachycardia at rest. His B/P has not been controlled- 170/80. His HR is always > 100, even at rest.    Current Outpatient Prescriptions  Medication Sig Dispense Refill  . aspirin EC 81 MG tablet Take 81 mg by mouth daily.    . celecoxib (CELEBREX) 200 MG capsule Take 200 mg by mouth daily.     Marland Kitchen glipiZIDE (GLUCOTROL) 5 MG tablet Take 5 mg by mouth 2 (two) times daily.     Marland Kitchen ibuprofen (ADVIL,MOTRIN) 800 MG tablet Take 800 mg by mouth every 8 (eight) hours as needed for moderate pain.     Marland Kitchen lisinopril (PRINIVIL,ZESTRIL) 10 MG tablet Take 10 mg by mouth daily.     . Multiple Vitamin (MULTIVITAMIN) capsule Take 1 capsule by mouth daily.    . Omega-3 Fatty Acids (FISH OIL PO) Take 1 capsule by mouth 3 (three) times daily.     . pantoprazole (PROTONIX) 20 MG tablet Take 1 tablet (20 mg total) by mouth daily. 30 tablet 0  . pioglitazone-metformin (ACTOPLUS MET) 15-850 MG per tablet Take 1 tablet by mouth 2 (two) times daily.    . sertraline (ZOLOFT) 50 MG tablet Take 50 mg by mouth daily.     . SUBOXONE 8-2 MG FILM Take 1 Film by mouth 3 (three) times daily.     Marland Kitchen testosterone cypionate (DEPOTESTOTERONE CYPIONATE) 200 MG/ML injection Inject 200 mg into the muscle every 14 (fourteen) days. 1 mL    . tiZANidine (ZANAFLEX) 4 MG capsule Take 4 mg by mouth 3 (three) times daily as needed for muscle spasms.     Marland Kitchen VIAGRA 100 MG tablet Take 100 mg by mouth as needed for erectile dysfunction.     Marland Kitchen VICTOZA 18 MG/3ML SOPN Take 18 mg by  mouth daily.     . metoprolol tartrate (LOPRESSOR) 25 MG tablet Take 1 tablet (25 mg total) by mouth 2 (two) times daily. 60 tablet 6   No current facility-administered medications for this visit.    No Known Allergies  History   Social History  . Marital Status: Married    Spouse Name: N/A    Number of Children: 3  . Years of Education: 12   Occupational History  . lead Musician [employer]   Social History Main Topics  . Smoking status: Never Smoker   . Smokeless tobacco: Never Used  . Alcohol Use: No  . Drug Use: No  . Sexual Activity: Not on file   Other Topics Concern  . Not on file   Social History Narrative     Review of Systems: General: negative for chills, fever, night sweats or weight changes.  Cardiovascular: negative for chest pain, dyspnea on exertion, edema, orthopnea, palpitations, paroxysmal nocturnal dyspnea or shortness of breath Dermatological: negative for rash Respiratory: negative for cough or wheezing Urologic: negative for hematuria Abdominal: negative for nausea, vomiting, diarrhea, bright red blood per rectum, melena, or hematemesis Neurologic: negative for visual changes, syncope,  or dizziness All other systems reviewed and are otherwise negative except as noted above.    Blood pressure 152/80, pulse 114, height _0  (1.778 m), weight 248 lb 14.4 oz (112.9 kg).  General appearance: alert, cooperative and no distress Neck: no carotid bruit, no JVD and thyroid feels full Lungs: clear to auscultation bilaterally Heart: regular rythm, increased rate  EKG NSR, ST, LVH  ASSESSMENT AND PLAN:   Tachycardia R/O hyperthyroid  Essential hypertension Uncontrolled  Chest pain Negative Myoview, CTA negative for PE   PLAN  Check labs including TSH,and  free T4, add Lopressor 25 mg BID. F/U depending on TSH results.   Jame Seelig KPA-C 07/18/2014 8:22 AM

## 2014-07-18 NOTE — Assessment & Plan Note (Signed)
Negative Myoview, CTA negative for PE

## 2014-07-21 ENCOUNTER — Telehealth: Payer: Self-pay | Admitting: Cardiology

## 2014-07-21 NOTE — Telephone Encounter (Signed)
I called Mr Pineda at work about his lab results. He is hyperthyroid. His HR today is in the 80s (lopressor added at OV). He is on an ASA 81 mg. He has been instructed to contact his PCP ASAP for further follow up Rx.  Kerin Ransom PA-C 07/21/2014 4:49 PM

## 2014-08-07 ENCOUNTER — Ambulatory Visit: Payer: Self-pay | Admitting: Cardiology

## 2014-08-13 ENCOUNTER — Encounter (HOSPITAL_COMMUNITY): Payer: Self-pay | Admitting: *Deleted

## 2014-08-13 ENCOUNTER — Emergency Department (HOSPITAL_COMMUNITY)
Admission: EM | Admit: 2014-08-13 | Discharge: 2014-08-13 | Disposition: A | Discharge status: Home / Self Care | Payer: Managed Care, Other (non HMO) | Attending: Emergency Medicine | Admitting: Emergency Medicine

## 2014-08-13 DIAGNOSIS — E0590 Thyrotoxicosis, unspecified without thyrotoxic crisis or storm: Secondary | ICD-10-CM | POA: Diagnosis not present

## 2014-08-13 DIAGNOSIS — R112 Nausea with vomiting, unspecified: Secondary | ICD-10-CM | POA: Insufficient documentation

## 2014-08-13 DIAGNOSIS — E860 Dehydration: Secondary | ICD-10-CM | POA: Diagnosis not present

## 2014-08-13 DIAGNOSIS — Z792 Long term (current) use of antibiotics: Secondary | ICD-10-CM | POA: Diagnosis not present

## 2014-08-13 DIAGNOSIS — R011 Cardiac murmur, unspecified: Secondary | ICD-10-CM | POA: Insufficient documentation

## 2014-08-13 DIAGNOSIS — R002 Palpitations: Secondary | ICD-10-CM | POA: Diagnosis not present

## 2014-08-13 DIAGNOSIS — I10 Essential (primary) hypertension: Secondary | ICD-10-CM | POA: Diagnosis not present

## 2014-08-13 DIAGNOSIS — R51 Headache: Secondary | ICD-10-CM | POA: Insufficient documentation

## 2014-08-13 DIAGNOSIS — E119 Type 2 diabetes mellitus without complications: Secondary | ICD-10-CM | POA: Insufficient documentation

## 2014-08-13 DIAGNOSIS — Z79899 Other long term (current) drug therapy: Secondary | ICD-10-CM | POA: Diagnosis not present

## 2014-08-13 DIAGNOSIS — R5383 Other fatigue: Secondary | ICD-10-CM | POA: Insufficient documentation

## 2014-08-13 DIAGNOSIS — Z7982 Long term (current) use of aspirin: Secondary | ICD-10-CM | POA: Insufficient documentation

## 2014-08-13 DIAGNOSIS — R1013 Epigastric pain: Secondary | ICD-10-CM | POA: Diagnosis present

## 2014-08-13 DIAGNOSIS — R000 Tachycardia, unspecified: Secondary | ICD-10-CM | POA: Insufficient documentation

## 2014-08-13 DIAGNOSIS — R0789 Other chest pain: Secondary | ICD-10-CM | POA: Diagnosis not present

## 2014-08-13 LAB — URINE MICROSCOPIC-ADD ON

## 2014-08-13 LAB — COMPREHENSIVE METABOLIC PANEL
ALBUMIN: 4.3 g/dL (ref 3.5–5.2)
ALT: 28 U/L (ref 0–53)
AST: 17 U/L (ref 0–37)
Alkaline Phosphatase: 110 U/L (ref 39–117)
Anion gap: 12 (ref 5–15)
BUN: 19 mg/dL (ref 6–23)
CHLORIDE: 100 mmol/L (ref 96–112)
CO2: 24 mmol/L (ref 19–32)
Calcium: 10 mg/dL (ref 8.4–10.5)
Creatinine, Ser: 0.69 mg/dL (ref 0.50–1.35)
GFR calc Af Amer: >90 mL/min (ref >90)
GFR calc non Af Amer: >90 mL/min (ref >90)
Glucose, Bld: 273 mg/dL — ABNORMAL HIGH (ref 70–99)
Potassium: 4.3 mmol/L (ref 3.5–5.1)
SODIUM: 136 mmol/L (ref 135–145)
TOTAL PROTEIN: 7.3 g/dL (ref 6.0–8.3)
Total Bilirubin: 1.1 mg/dL (ref 0.3–1.2)

## 2014-08-13 LAB — URINALYSIS, ROUTINE W REFLEX MICROSCOPIC
Glucose, UA: >1000 mg/dL — AB
HGB URINE DIPSTICK: NEGATIVE
Ketones, ur: >80 mg/dL — AB
Leukocytes, UA: NEGATIVE
NITRITE: NEGATIVE
Protein, ur: 30 mg/dL — AB
Specific Gravity, Urine: 1.038 — ABNORMAL HIGH (ref 1.005–1.030)
Urobilinogen, UA: 0.2 mg/dL (ref 0.0–1.0)
pH: 5.5 (ref 5.0–8.0)

## 2014-08-13 LAB — CBC WITH DIFFERENTIAL/PLATELET
BASOS PCT: 0 % (ref 0–1)
Basophils Absolute: 0 10*3/uL (ref 0.0–0.1)
Eosinophils Absolute: 0 10*3/uL (ref 0.0–0.7)
Eosinophils Relative: 0 % (ref 0–5)
HCT: 41.5 % (ref 39.0–52.0)
Hemoglobin: 14.9 g/dL (ref 13.0–17.0)
LYMPHS ABS: 0.4 10*3/uL — AB (ref 0.7–4.0)
LYMPHS PCT: 3 % — AB (ref 12–46)
MCH: 29.4 pg (ref 26.0–34.0)
MCHC: 35.9 g/dL (ref 30.0–36.0)
MCV: 81.9 fL (ref 78.0–100.0)
MONOS PCT: 3 % (ref 3–12)
Monocytes Absolute: 0.4 10*3/uL (ref 0.1–1.0)
NEUTROS PCT: 94 % — AB (ref 43–77)
Neutro Abs: 11.2 10*3/uL — ABNORMAL HIGH (ref 1.7–7.7)
PLATELETS: 246 10*3/uL (ref 150–400)
RBC: 5.07 MIL/uL (ref 4.22–5.81)
RDW: 13.4 % (ref 11.5–15.5)
WBC: 12 10*3/uL — ABNORMAL HIGH (ref 4.0–10.5)

## 2014-08-13 LAB — TSH: TSH: 0.139 u[IU]/mL — ABNORMAL LOW (ref 0.350–4.500)

## 2014-08-13 LAB — LIPASE, BLOOD: Lipase: 24 U/L (ref 11–59)

## 2014-08-13 LAB — CBG MONITORING, ED: GLUCOSE-CAPILLARY: 254 mg/dL — AB (ref 70–99)

## 2014-08-13 MED ORDER — SODIUM CHLORIDE 0.9 % IV SOLN
1000.0000 mL | INTRAVENOUS | Status: DC
  Administered 2014-08-13: 1000 mL via INTRAVENOUS

## 2014-08-13 MED ORDER — METOPROLOL TARTRATE 25 MG PO TABS
25.0000 mg | ORAL_TABLET | Freq: Once | ORAL | Status: AC
  Administered 2014-08-13: 25 mg via ORAL
  Filled 2014-08-13: qty 1

## 2014-08-13 MED ORDER — SODIUM CHLORIDE 0.9 % IV SOLN
1000.0000 mL | Freq: Once | INTRAVENOUS | Status: AC
  Administered 2014-08-13: 1000 mL via INTRAVENOUS

## 2014-08-13 MED ORDER — KETOROLAC TROMETHAMINE 30 MG/ML IJ SOLN
30.0000 mg | Freq: Once | INTRAMUSCULAR | Status: AC
  Administered 2014-08-13: 30 mg via INTRAVENOUS
  Filled 2014-08-13: qty 1

## 2014-08-13 MED ORDER — PROCHLORPERAZINE EDISYLATE 5 MG/ML IJ SOLN: 10.0000 mg | Freq: Four times a day (QID) | INTRAMUSCULAR | Status: DC | PRN

## 2014-08-13 MED ORDER — ONDANSETRON 4 MG PO TBDP: 4.0000 mg | ORAL_TABLET | Freq: Three times a day (TID) | ORAL | Status: DC | PRN

## 2014-08-13 MED ORDER — DIPHENHYDRAMINE HCL 50 MG/ML IJ SOLN
12.5000 mg | Freq: Once | INTRAMUSCULAR | Status: AC
  Administered 2014-08-13: 12.5 mg via INTRAVENOUS
  Filled 2014-08-13: qty 1

## 2014-08-13 MED ORDER — PROCHLORPERAZINE EDISYLATE 5 MG/ML IJ SOLN
10.0000 mg | Freq: Once | INTRAMUSCULAR | Status: AC
  Administered 2014-08-13: 10 mg via INTRAVENOUS
  Filled 2014-08-13: qty 2

## 2014-08-13 NOTE — ED Notes (Signed)
Pt has a fasr heart beat.  Hx of the same poss dehydration

## 2014-08-13 NOTE — Discharge Instructions (Signed)

## 2014-08-13 NOTE — ED Notes (Signed)
Lab contacted to add-on T4, free.

## 2014-08-13 NOTE — ED Provider Notes (Signed)
CSN: [id number]     Arrival date & time 08/13/14  1558 History   First MD Initiated Contact with Patient 08/13/14 1633     Chief Complaint  Patient presents with  . Abdominal Pain     (Consider location/radiation/quality/duration/timing/severity/associated sxs/prior Treatment) HPI  49 year old male past history of type 2 diabetes, hypertension, hyperthyroidism who presents to ED complaining of nausea and vomiting that began after waking this morning. Patient reports he had a headache last night that was not atypical for him. Patient states his headache has persisted throughout the day today. Patient states he has had some mild epigastric pain which began after vomiting. He denies vomiting any blood. Patient reports abdominal discomfort is mild and made worse with palpation. Patient states he took Tylenol earlier for headache which helped some but his headache is still present. Patient went to his PCP where he was found to be tachycardic and dehydrated. PCP felt patient would be best served by coming to the ED for further evaluation as he was concerned about dehydration and symptoms suggestive of abnormal thyroid levels. PCP also started pt on Methimazole today, although pt has not had filled yet. Patient denies having any recent illness, fevers, chills, cough, chest pain, shortness of breath, diarrhea. Patient denies having any history of DKA or thyrotoxicosis requiring admission. No other complaints at this time.  Past Medical History  Diagnosis Date  . Diabetes   . Hypertension   . Chest pain    Past Surgical History  Procedure Laterality Date  . Shoulder surgery Left   . Knee surgery Right    Family History  Problem Relation Age of Onset  . Stroke Paternal Grandmother   . Diabetes Paternal Grandmother   . Lung cancer Paternal Grandfather   . Stroke Paternal Grandfather   . Diabetes Paternal Grandfather   . Thyroid cancer Brother   . Heart attack Brother   . Mental retardation  Other     half-brother x2   History  Substance Use Topics  . Smoking status: Never Smoker   . Smokeless tobacco: Never Used  . Alcohol Use: No    Review of Systems  Constitutional: Positive for activity change and fatigue. Negative for fever, diaphoresis and appetite change.  HENT: Negative for congestion, rhinorrhea and sore throat.   Eyes: Negative for visual disturbance.  Respiratory: Positive for chest tightness. Negative for cough and shortness of breath.   Cardiovascular: Positive for palpitations. Negative for chest pain and leg swelling.  Gastrointestinal: Positive for nausea, vomiting and abdominal pain. Negative for diarrhea.  Genitourinary: Negative for dysuria, flank pain, decreased urine volume and difficulty urinating.  Musculoskeletal: Negative for back pain and neck pain.  Skin: Negative for rash.  Neurological: Positive for headaches. Negative for dizziness, syncope, speech difficulty, weakness, light-headedness and numbness.  Psychiatric/Behavioral: Negative for confusion.      Allergies  Review of patient's allergies indicates no known allergies.  Home Medications   Prior to Admission medications   Medication Sig Start Date End Date Taking? Authorizing Provider  aspirin EC 81 MG tablet Take 81 mg by mouth daily.    Historical Provider, MD  celecoxib (CELEBREX) 200 MG capsule Take 200 mg by mouth daily.  02/27/14   Historical Provider, MD  glipiZIDE (GLUCOTROL) 5 MG tablet Take 5 mg by mouth 2 (two) times daily.  02/01/14   Historical Provider, MD  ibuprofen (ADVIL,MOTRIN) 800 MG tablet Take 800 mg by mouth every 8 (eight) hours as needed for moderate pain.  Historical Provider, MD  lisinopril (PRINIVIL,ZESTRIL) 10 MG tablet Take 10 mg by mouth daily.  03/11/14   Historical Provider, MD  metoprolol tartrate (LOPRESSOR) 25 MG tablet Take 1 tablet (25 mg total) by mouth 2 (two) times daily. 07/18/14   Erlene Quan, PA-C  Multiple Vitamin (MULTIVITAMIN) capsule  Take 1 capsule by mouth daily.    Historical Provider, MD  Omega-3 Fatty Acids (FISH OIL PO) Take 1 capsule by mouth 3 (three) times daily.     Historical Provider, MD  pantoprazole (PROTONIX) 20 MG tablet Take 1 tablet (20 mg total) by mouth daily. 03/19/14   Charlesetta Shanks, MD  pioglitazone-metformin (ACTOPLUS MET) 15-850 MG per tablet Take 1 tablet by mouth 2 (two) times daily. 02/27/14   Historical Provider, MD  sertraline (ZOLOFT) 50 MG tablet Take 50 mg by mouth daily.  03/11/14   Historical Provider, MD  SUBOXONE 8-2 MG FILM Take 1 Film by mouth 3 (three) times daily.  03/03/14   Historical Provider, MD  testosterone cypionate (DEPOTESTOTERONE CYPIONATE) 200 MG/ML injection Inject 200 mg into the muscle every 14 (fourteen) days. 1 mL    Historical Provider, MD  tiZANidine (ZANAFLEX) 4 MG capsule Take 4 mg by mouth 3 (three) times daily as needed for muscle spasms.  12/23/13   Historical Provider, MD  VIAGRA 100 MG tablet Take 100 mg by mouth as needed for erectile dysfunction.  12/27/13   Historical Provider, MD  VICTOZA 18 MG/3ML SOPN Take 18 mg by mouth daily.  12/27/13   Historical Provider, MD   BP 151/101 mmHg  Pulse 129  Temp(Src) 98.3 F (36.8 C) (Oral)  Resp 20  Ht 5' 10"  (1.778 m)  Wt 229 lb (103.874 kg)  BMI 32.86 kg/m2  SpO2 95% Physical Exam  Constitutional: He is oriented to person, place, and time. He appears well-developed and well-nourished. No distress.  HENT:  Head: Normocephalic and atraumatic.  Nose: Nose normal.  Mouth/Throat: Oropharynx is clear and moist. No oropharyngeal exudate.  Eyes: Conjunctivae and EOM are normal.  Neck: Normal range of motion. Neck supple. No JVD present.  Cardiovascular: Regular rhythm and intact distal pulses.  Tachycardia present.   Murmur heard.  Systolic murmur is present with a grade of 2/6  Murmur L sternal border  Pulmonary/Chest: Effort normal and breath sounds normal. No respiratory distress.  Abdominal: Soft. He exhibits no  distension. There is no tenderness. There is no rebound and no guarding.  Musculoskeletal: Normal range of motion. He exhibits no edema (lower ext) or tenderness (lower ext).  Neurological: He is alert and oriented to person, place, and time. No cranial nerve deficit.  Skin: Skin is warm and dry. No rash noted.  Psychiatric: He has a normal mood and affect.  Nursing note and vitals reviewed.   ED Course  Procedures (including critical care time) Labs Review Labs Reviewed  CBC WITH DIFFERENTIAL/PLATELET - Abnormal; Notable for the following:    WBC 12.0 (*)    Neutrophils Relative % 94 (*)    Neutro Abs 11.2 (*)    Lymphocytes Relative 3 (*)    Lymphs Abs 0.4 (*)    All other components within normal limits  TSH - Abnormal; Notable for the following:    TSH 0.139 (*)    All other components within normal limits  COMPREHENSIVE METABOLIC PANEL - Abnormal; Notable for the following:    Glucose, Bld 273 (*)    All other components within normal limits  URINALYSIS, ROUTINE W REFLEX  MICROSCOPIC - Abnormal; Notable for the following:    Specific Gravity, Urine 1.038 (*)    Glucose, UA >1000 (*)    Bilirubin Urine SMALL (*)    Ketones, ur >80 (*)    Protein, ur 30 (*)    All other components within normal limits  URINE MICROSCOPIC-ADD ON - Abnormal; Notable for the following:    Bacteria, UA FEW (*)    All other components within normal limits  CBG MONITORING, ED - Abnormal; Notable for the following:    Glucose-Capillary 254 (*)    All other components within normal limits  URINE CULTURE  LIPASE, BLOOD    Imaging Review No results found.   EKG Interpretation   Date/Time:  Wednesday August 13 2014 16:09:10 EST Ventricular Rate:  128 PR Interval:  152 QRS Duration: 98 QT Interval:  286 QTC Calculation: 417 R Axis:   84 Text Interpretation:  Sinus tachycardia Biatrial enlargement Left  ventricular hypertrophy ST abnormality, possible digitalis effect Abnormal  ECG  Confirmed by Hazle Coca [phone]) on 08/13/2014 4:16:51 PM      MDM   Final diagnoses:  None    Scott Simmons is a 49 y.o. male with H&P as above. Patient presents to ED complaining of nausea and vomiting, dehydration. Came from PCP who was concerned about dehydration & further eval. Records that accompanied patient from visit today showed PCP planned on starting patient on methimazole. On arrival, patient is well-appearing and in no apparent distress but is noted to be tachycardic. EKG shows sinus tach with occasional PVCs and not significantly changed from prior. Patient is not having any chest pain. Abdominal exam is benign. Initial concern is for dehydration 2/2 acute viral illness vs thyrotoxicosis versus less likely DKA in this type II diabetic patient who has never had history of such. Less likely has gastroenteritis as patient is without diarrhea. As for patient's headache having this is likely secondary to his dehydration status although pt has h/o migraine. Patient will receive IV fluids, headache cocktail, screening labs for further evaluation.  Workup notable for TSH that is low but improved from last month as well as a UA consistent with dehydration. Patient received 2 L of normal saline while in ED, his home dose of metoprolol which he missed this morning, as well as a headache cocktail. On reevaluation patient reports he feels much better. Patient heart rate has come down some. Patient appears stable for discharge. Lengthy discussion with patient regarding follow-up and patient states he can see his PCP tomorrow for recheck. Strict return precautions were discussed at length and patient voices understanding. Patient will be discharged home with short supply of anti-emetics.  Clinical Impression: 1. Dehydration   2. Non-intractable vomiting with nausea, vomiting of unspecified type     Disposition: Discharge  Condition: Good  I have discussed the results, Dx and Tx plan with the pt(&  family if present). He/she/they expressed understanding and agree(s) with the plan. Discharge instructions discussed at great length. Strict return precautions discussed and pt &/or family have verbalized understanding of the instructions. No further questions at time of discharge.    New Prescriptions   ONDANSETRON (ZOFRAN ODT) 4 MG DISINTEGRATING TABLET    Take 1 tablet (4 mg total) by mouth every 8 (eight) hours as needed for nausea or vomiting.    Follow Up: Wenda Low, MD [address] [place] [address] [phone]  Schedule an appointment as soon as possible for a visit in  1 day   [place] [address] [id number] [place] [phone]  If symptoms worsen   Pt seen in conjunction with Dr. Mirna Mires, MD  Kirstie Peri, [facility] Resident - PGY-2      Kirstie Peri, MD 08/13/14 0746  Mirna Mires, MD 08/13/14 [phone]

## 2014-08-13 NOTE — ED Notes (Signed)
Pt denies any nausea/vomiting upon assessment.

## 2014-08-13 NOTE — ED Notes (Signed)
The pt is c/o abd pain  Since yesterday with nand v.  He is a diabetic.  He was seen bu his dictoir toiday and sent here for iv fluids

## 2014-08-13 NOTE — ED Notes (Signed)
Pt denies any reaction to medication.

## 2014-08-14 LAB — URINE CULTURE
CULTURE: NO GROWTH
Colony Count: NO GROWTH

## 2014-08-14 LAB — T4, FREE: Free T4: 3.88 ng/dL — ABNORMAL HIGH (ref 0.80–1.80)

## 2014-08-19 ENCOUNTER — Ambulatory Visit: Payer: Self-pay | Admitting: Physician Assistant

## 2014-09-03 ENCOUNTER — Ambulatory Visit: Payer: Self-pay | Admitting: Physician Assistant

## 2015-04-06 ENCOUNTER — Other Ambulatory Visit (HOSPITAL_COMMUNITY): Payer: Self-pay | Admitting: Internal Medicine

## 2015-04-06 DIAGNOSIS — E0590 Thyrotoxicosis, unspecified without thyrotoxic crisis or storm: Secondary | ICD-10-CM

## 2015-04-06 DIAGNOSIS — E0500 Thyrotoxicosis with diffuse goiter without thyrotoxic crisis or storm: Secondary | ICD-10-CM

## 2015-04-28 ENCOUNTER — Encounter (HOSPITAL_COMMUNITY)
Admission: RE | Admit: 2015-04-28 | Discharge: 2015-04-28 | Disposition: A | Discharge status: Home / Self Care | Payer: Managed Care, Other (non HMO) | Source: Ambulatory Visit | Attending: Internal Medicine | Admitting: Internal Medicine

## 2015-04-28 DIAGNOSIS — E0500 Thyrotoxicosis with diffuse goiter without thyrotoxic crisis or storm: Secondary | ICD-10-CM

## 2015-04-28 MED ORDER — SODIUM IODIDE I 131 CAPSULE
7.7500 | Freq: Once | INTRAVENOUS | Status: AC | PRN
  Administered 2015-04-28: 7.75 via ORAL

## 2015-04-29 ENCOUNTER — Encounter (HOSPITAL_COMMUNITY)
Admission: RE | Admit: 2015-04-29 | Discharge: 2015-04-29 | Disposition: A | Discharge status: Home / Self Care | Payer: Managed Care, Other (non HMO) | Source: Ambulatory Visit | Attending: Internal Medicine | Admitting: Internal Medicine

## 2015-04-29 DIAGNOSIS — E0500 Thyrotoxicosis with diffuse goiter without thyrotoxic crisis or storm: Secondary | ICD-10-CM | POA: Insufficient documentation

## 2015-04-29 MED ORDER — SODIUM PERTECHNETATE TC 99M INJECTION
10.3300 | Freq: Once | INTRAVENOUS | Status: AC | PRN
  Administered 2015-04-29: 10 via INTRAVENOUS

## 2015-05-08 ENCOUNTER — Encounter (HOSPITAL_COMMUNITY)
Admission: RE | Admit: 2015-05-08 | Discharge: 2015-05-08 | Disposition: A | Discharge status: Home / Self Care | Payer: Managed Care, Other (non HMO) | Source: Ambulatory Visit | Attending: Internal Medicine | Admitting: Internal Medicine

## 2015-05-08 DIAGNOSIS — E0590 Thyrotoxicosis, unspecified without thyrotoxic crisis or storm: Secondary | ICD-10-CM | POA: Insufficient documentation

## 2015-05-08 MED ORDER — SODIUM IODIDE I 131 CAPSULE
17.8700 | Freq: Once | INTRAVENOUS | Status: AC | PRN
  Administered 2015-05-08: 17.87 via ORAL

## 2015-10-22 ENCOUNTER — Other Ambulatory Visit: Payer: Self-pay | Admitting: Gastroenterology

## 2015-10-30 ENCOUNTER — Encounter (HOSPITAL_COMMUNITY): Payer: Self-pay | Admitting: *Deleted

## 2015-11-09 ENCOUNTER — Encounter (HOSPITAL_COMMUNITY): Payer: Self-pay | Admitting: Anesthesiology

## 2015-11-09 ENCOUNTER — Ambulatory Visit (HOSPITAL_COMMUNITY): Payer: Managed Care, Other (non HMO) | Admitting: Anesthesiology

## 2015-11-09 ENCOUNTER — Encounter (HOSPITAL_COMMUNITY)
Admission: RE | Disposition: A | Discharge status: Home / Self Care | Payer: Self-pay | Source: Ambulatory Visit | Attending: Gastroenterology

## 2015-11-09 ENCOUNTER — Ambulatory Visit (HOSPITAL_COMMUNITY)
Admission: RE | Admit: 2015-11-09 | Discharge: 2015-11-09 | Disposition: A | Discharge status: Home / Self Care | Payer: Managed Care, Other (non HMO) | Source: Ambulatory Visit | Attending: Gastroenterology | Admitting: Gastroenterology

## 2015-11-09 DIAGNOSIS — E7800 Pure hypercholesterolemia, unspecified: Secondary | ICD-10-CM | POA: Diagnosis not present

## 2015-11-09 DIAGNOSIS — D175 Benign lipomatous neoplasm of intra-abdominal organs: Secondary | ICD-10-CM | POA: Diagnosis not present

## 2015-11-09 DIAGNOSIS — Z1211 Encounter for screening for malignant neoplasm of colon: Secondary | ICD-10-CM | POA: Insufficient documentation

## 2015-11-09 DIAGNOSIS — I10 Essential (primary) hypertension: Secondary | ICD-10-CM | POA: Insufficient documentation

## 2015-11-09 DIAGNOSIS — K317 Polyp of stomach and duodenum: Secondary | ICD-10-CM | POA: Diagnosis not present

## 2015-11-09 DIAGNOSIS — E119 Type 2 diabetes mellitus without complications: Secondary | ICD-10-CM | POA: Diagnosis not present

## 2015-11-09 DIAGNOSIS — G4733 Obstructive sleep apnea (adult) (pediatric): Secondary | ICD-10-CM | POA: Insufficient documentation

## 2015-11-09 HISTORY — PX: ESOPHAGOGASTRODUODENOSCOPY (EGD) WITH PROPOFOL: SHX5813

## 2015-11-09 HISTORY — PX: COLONOSCOPY WITH PROPOFOL: SHX5780

## 2015-11-09 HISTORY — DX: Unspecified thoracic, thoracolumbar and lumbosacral intervertebral disc disorder: M51.9

## 2015-11-09 HISTORY — DX: Sleep apnea, unspecified: G47.30

## 2015-11-09 HISTORY — DX: Other cervical disc displacement, unspecified cervical region: M50.20

## 2015-11-09 LAB — GLUCOSE, CAPILLARY: GLUCOSE-CAPILLARY: 94 mg/dL (ref 65–99)

## 2015-11-09 SURGERY — COLONOSCOPY WITH PROPOFOL
Anesthesia: Monitor Anesthesia Care

## 2015-11-09 MED ORDER — PROPOFOL 10 MG/ML IV BOLUS
INTRAVENOUS | Status: AC
  Filled 2015-11-09: qty 40

## 2015-11-09 MED ORDER — SODIUM CHLORIDE 0.9 % IV SOLN
INTRAVENOUS | Status: DC | PRN
  Administered 2015-11-09: 12:00:00 via INTRAVENOUS

## 2015-11-09 MED ORDER — PROPOFOL 500 MG/50ML IV EMUL
INTRAVENOUS | Status: DC | PRN
  Administered 2015-11-09 (×2): 20 mg via INTRAVENOUS
  Administered 2015-11-09: 50 mg via INTRAVENOUS
  Administered 2015-11-09: 20 mg via INTRAVENOUS

## 2015-11-09 MED ORDER — PROPOFOL 10 MG/ML IV BOLUS
INTRAVENOUS | Status: AC
  Filled 2015-11-09: qty 20

## 2015-11-09 MED ORDER — PROPOFOL 500 MG/50ML IV EMUL
INTRAVENOUS | Status: DC | PRN
  Administered 2015-11-09: 100 ug/kg/min via INTRAVENOUS

## 2015-11-09 MED ORDER — LIDOCAINE HCL (CARDIAC) 20 MG/ML IV SOLN
INTRAVENOUS | Status: AC
  Filled 2015-11-09: qty 5

## 2015-11-09 SURGICAL SUPPLY — 25 items

## 2015-11-09 NOTE — Discharge Instructions (Signed)
Colonoscopy, Care After °These instructions give you information on caring for yourself after your procedure. Your doctor may also give you more specific instructions. Call your doctor if you have any problems or questions after your procedure. °HOME CARE °· Do not drive for 24 hours. °· Do not sign important papers or use machinery for 24 hours. °· You may shower. °· You may go back to your usual activities, but go slower for the first 24 hours. °· Take rest breaks often during the first 24 hours. °· Walk around or use warm packs on your belly (abdomen) if you have belly cramping or gas. °· Drink enough fluids to keep your pee (urine) clear or pale yellow. °· Resume your normal diet. Avoid heavy or fried foods. °· Avoid drinking alcohol for 24 hours or as told by your doctor. °· Only take medicines as told by your doctor. °If a tissue sample (biopsy) was taken during the procedure:  °· Do not take aspirin or blood thinners for 7 days, or as told by your doctor. °· Do not drink alcohol for 7 days, or as told by your doctor. °· Eat soft foods for the first 24 hours. °GET HELP IF: °You still have a small amount of blood in your poop (stool) 2-3 days after the procedure. °GET HELP RIGHT AWAY IF: °· You have more than a small amount of blood in your poop. °· You see clumps of tissue (blood clots) in your poop. °· Your belly is puffy (swollen). °· You feel sick to your stomach (nauseous) or throw up (vomit). °· You have a fever. °· You have belly pain that gets worse and medicine does not help. °MAKE SURE YOU: °· Understand these instructions. °· Will watch your condition. °· Will get help right away if you are not doing well or get worse. °  °This information is not intended to replace advice given to you by your health care provider. Make sure you discuss any questions you have with your health care provider. °  °Document Released: 07/30/2010 Document Revised: 07/02/2013 Document Reviewed: 03/04/2013 °Elsevier  Interactive Patient Education ©2016 [employer] °Esophagogastroduodenoscopy, Care After °Refer to this sheet in the next few weeks. These instructions provide you with information about caring for yourself after your procedure. Your health care provider may also give you more specific instructions. Your treatment has been planned according to current medical practices, but problems sometimes occur. Call your health care provider if you have any problems or questions after your procedure. °WHAT TO EXPECT AFTER THE PROCEDURE °After your procedure, it is typical to feel: °· Soreness in your throat. °· Pain with swallowing. °· Sick to your stomach (nauseous). °· Bloated. °· Dizzy. °· Fatigued. °HOME CARE INSTRUCTIONS °· Do not eat or drink anything until the numbing medicine (local anesthetic) has worn off and your gag reflex has returned. You will know that the local anesthetic has worn off when you can swallow comfortably. °· Do not drive or operate machinery until directed by your health care provider. °· Take medicines only as directed by your health care provider. °SEEK MEDICAL CARE IF:  °· You cannot stop coughing. °· You are not urinating at all or less than usual. °SEEK IMMEDIATE MEDICAL CARE IF: °· You have difficulty swallowing. °· You cannot eat or drink. °· You have worsening throat or chest pain. °· You have dizziness or lightheadedness or you faint. °· You have nausea or vomiting. °· You have chills. °· You have a fever. °·   You have severe abdominal pain. °· You have black, tarry, or bloody stools. °  °This information is not intended to replace advice given to you by your health care provider. Make sure you discuss any questions you have with your health care provider. °  °Document Released: 06/13/2012 Document Revised: 07/18/2014 Document Reviewed: 06/13/2012 °Elsevier Interactive Patient Education ©2016 [employer] Inc. ° °

## 2015-11-09 NOTE — Transfer of Care (Signed)
Immediate Anesthesia Transfer of Care Note  Patient: Scott Simmons  Procedure(s) Performed: Procedure(s): COLONOSCOPY WITH PROPOFOL (N/A) ESOPHAGOGASTRODUODENOSCOPY (EGD) WITH PROPOFOL (N/A)  Patient Location: PACU  Anesthesia Type:MAC  Level of Consciousness: awake, alert  and oriented  Airway & Oxygen Therapy: Patient Spontanous Breathing and Patient connected to face mask oxygen  Post-op Assessment: Report given to RN and Post -op Vital signs reviewed and stable  Post vital signs: Reviewed and stable  Last Vitals:  Filed Vitals:   11/09/15 1149  BP: 145/83  Pulse: 78  Temp: 36.9 C  Resp: 13    Last Pain:  Filed Vitals:   11/09/15 1209  PainSc: 7          Complications: No apparent anesthesia complications

## 2015-11-09 NOTE — Op Note (Signed)
[facility] Patient Name: Scott Simmons Procedure Date: 11/09/2015 MRN: [id number] Attending MD: Garlan Fair , MD Date of Birth: 04-05-1966 CSN:  Age: 50 Admit Type: Outpatient Procedure:                Colonoscopy Indications:              Screening for colorectal malignant neoplasm Providers:                Garlan Fair, MD, Laverta Baltimore, RN, Alfonso Patten, Technician, Rosario Adie, CRNA Referring MD:              Medicines:                Propofol per Anesthesia Complications:            No immediate complications. Estimated Blood Loss:     Estimated blood loss: none. Procedure:                Pre-Anesthesia Assessment:                           - Prior to the procedure, a History and Physical                            was performed, and patient medications and                            allergies were reviewed. The patient's tolerance of                            previous anesthesia was also reviewed. The risks                            and benefits of the procedure and the sedation                            options and risks were discussed with the patient.                            All questions were answered, and informed consent                            was obtained. Prior Anticoagulants: The patient has                            taken aspirin, last dose was 7 days prior to                            procedure. ASA Grade Assessment: II - A patient                            with mild systemic disease. After reviewing the  risks and benefits, the patient was deemed in                            satisfactory condition to undergo the procedure.                           - Prior to the procedure, a History and Physical                            was performed, and patient medications and                            allergies were reviewed. The patient's tolerance of      previous anesthesia was also reviewed. The risks                            and benefits of the procedure and the sedation                            options and risks were discussed with the patient.                            All questions were answered, and informed consent                            was obtained. Prior Anticoagulants: The patient has                            taken aspirin, last dose was 7 days prior to                            procedure. ASA Grade Assessment: II - A patient                            with mild systemic disease. After reviewing the                            risks and benefits, the patient was deemed in                            satisfactory condition to undergo the procedure.                           After obtaining informed consent, the colonoscope                            was passed under direct vision. Throughout the                            procedure, the patient's blood pressure, pulse, and                            oxygen saturations were monitored continuously. The  Colonoscope was introduced through the anus and                            advanced to the the cecum, identified by                            appendiceal orifice and ileocecal valve. The                            colonoscopy was performed without difficulty. The                            patient tolerated the procedure well. The quality                            of the bowel preparation was good. The ileocecal                            valve, the appendiceal orifice and the rectum were                            photographed. Scope In: 12:31:13 PM Scope Out: 12:48:04 PM Scope Withdrawal Time: 0 hours 9 minutes 44 seconds  Total Procedure Duration: 0 hours 16 minutes 51 seconds  Findings:      The perianal and digital rectal examinations were normal.      The entire examined colon appeared normal. A 10 mm lipoma was present in       the  hepatic flexure and a lipomatous fold was present in the mid       transverse colon. Impression:               - The entire examined colon is normal.                           - No specimens collected. Moderate Sedation:      N/A- Per Anesthesia Care Recommendation:           - Patient has a contact number available for                            emergencies. The signs and symptoms of potential                            delayed complications were discussed with the                            patient. Return to normal activities tomorrow.                            Written discharge instructions were provided to the                            patient.                           - Repeat colonoscopy in  10 years for screening                            purposes.                           - Resume previous diet.                           - Continue present medications. Procedure Code(s):        --- Professional ---                           [phone], Colonoscopy, flexible; diagnostic, including                            collection of specimen(s) by brushing or washing,                            when performed (separate procedure) Diagnosis Code(s):        --- Professional ---                           Z12.11, Encounter for screening for malignant                            neoplasm of colon CPT copyright 2016 [employer]. All rights reserved. The codes documented in this report are preliminary and upon coder review may  be revised to meet current compliance requirements. Earle Gell, MD Garlan Fair, MD 11/09/2015 1:03:59 PM This report has been signed electronically. Number of Addenda: 0

## 2015-11-09 NOTE — Anesthesia Postprocedure Evaluation (Signed)
Anesthesia Post Note  Patient: Scott Simmons  Procedure(s) Performed: Procedure(s) (LRB): COLONOSCOPY WITH PROPOFOL (N/A) ESOPHAGOGASTRODUODENOSCOPY (EGD) WITH PROPOFOL (N/A)  Patient location during evaluation: PACU Anesthesia Type: MAC Level of consciousness: awake Pain management: pain level controlled Vital Signs Assessment: post-procedure vital signs reviewed and stable Cardiovascular status: stable Anesthetic complications: no    Last Vitals:  Filed Vitals:   11/09/15 1300 11/09/15 1310  BP: 124/82 151/91  Pulse: 87 79  Temp:    Resp: 15 17    Last Pain:  Filed Vitals:   11/09/15 1311  PainSc: 7                  EDWARDS,Shellyann Wandrey

## 2015-11-09 NOTE — Anesthesia Preprocedure Evaluation (Addendum)
Anesthesia Evaluation  Patient identified by MRN, date of birth, ID band  Reviewed: Allergy & Precautions, NPO status , Patient's Chart, lab work & pertinent test results  Airway Mallampati: II  TM Distance: >3 FB Neck ROM: Full    Dental   Pulmonary sleep apnea ,    breath sounds clear to auscultation       Cardiovascular hypertension,  Rhythm:Regular Rate:Normal     Neuro/Psych    GI/Hepatic negative GI ROS, Neg liver ROS,   Endo/Other  diabetes  Renal/GU      Musculoskeletal   Abdominal   Peds  Hematology   Anesthesia Other Findings   Reproductive/Obstetrics                           Anesthesia Physical Anesthesia Plan  ASA: III  Anesthesia Plan: MAC   Post-op Pain Management:    Induction: Intravenous  Airway Management Planned: Simple Face Mask  Additional Equipment:   Intra-op Plan:   Post-operative Plan:   Informed Consent: I have reviewed the patients History and Physical, chart, labs and discussed the procedure including the risks, benefits and alternatives for the proposed anesthesia with the patient or authorized representative who has indicated his/her understanding and acceptance.   Dental advisory given  Plan Discussed with: CRNA and Anesthesiologist  Anesthesia Plan Comments:         Anesthesia Quick Evaluation

## 2015-11-09 NOTE — H&P (Signed)
  Procedures: Baseline screening colonoscopy. Esophagogastroduodenoscopy to remove a hyperplastic gastric polyp.  History: The patient is a 50 year old male born 1965-11-04. He is scheduled to undergo his first screening colonoscopy with polypectomy to prevent colon cancer.  In October 2012, he underwent a diagnostic esophagogastroduodenoscopy. A 10 mm gastric cardia hyperplastic polyp was biopsied but not removed.  He is also scheduled to undergo repeat esophagogastroduodenoscopy to remove the hyperplastic gastric cardia polyp.  Past medical history: Type 2 diabetes mellitus. Hypertension. Hypercholesterolemia. Obstructive sleep apnea syndrome. Chronic back pain. Depression. Graves hyperthyroidism. Right shoulder surgery. Left knee surgery.  Exam: The patient is alert and lying comfortably on the endoscopy stretcher. Abdomen is soft and nontender to palpation. Lungs are clear to auscultation. Cardiac exam reveals a regular rhythm.  Plan: Proceed with screening colonoscopy and esophagogastroduodenoscopy with hyperplastic gastric polyp removal.

## 2015-11-09 NOTE — Op Note (Signed)
[facility] Patient Name: Scott Simmons Procedure Date: 11/09/2015 MRN: [id number] Attending MD: Garlan Fair , MD Date of Birth: [birth year] CSN:  Age: 50 Admit Type: Outpatient Procedure:                Upper GI endoscopy Indications:              Heartburn Providers:                Garlan Fair, MD, Laverta Baltimore, RN, Alfonso Patten, Technician, Rosario Adie, CRNA Referring MD:              Medicines:                Propofol per Anesthesia Complications:            No immediate complications. Estimated Blood Loss:     Estimated blood loss: none. Procedure:                Pre-Anesthesia Assessment:                           - Prior to the procedure, a History and Physical                            was performed, and patient medications and                            allergies were reviewed. The patient's tolerance of                            previous anesthesia was also reviewed. The risks                            and benefits of the procedure and the sedation                            options and risks were discussed with the patient.                            All questions were answered, and informed consent                            was obtained. Prior Anticoagulants: The patient has                            taken aspirin, last dose was 7 days prior to                            procedure. ASA Grade Assessment: II - A patient                            with mild systemic disease. After reviewing the  risks and benefits, the patient was deemed in                            satisfactory condition to undergo the procedure.                           After obtaining informed consent, the endoscope was                            passed under direct vision. Throughout the                            procedure, the patient's blood pressure, pulse, and                            oxygen saturations  were monitored continuously. The                            [id number]) scope was introduced through the                            mouth, and advanced to the second part of duodenum.                            The upper GI endoscopy was accomplished without                            difficulty. The patient tolerated the procedure                            well. Scope In: Scope Out: Findings:      The Z-line was irregular and was found 40 cm from the incisors.      The examined esophagus was normal. I cannot R/O short segment Barrett       mucosa.      A single 10 mm pedunculated and sessile villous appearing polyp with no       bleeding and no stigmata of recent bleeding was found in the cardia just       distal to the irregular Z line. Biopsies were taken with a cold forceps       for histology.      The examined duodenum was normal. Impression:               - Z-line irregular, 40 cm from the incisors.                           - Normal esophagus.                           - A single gastric polyp. Biopsied.                           - Normal examined duodenum. Moderate Sedation:      N/A- Per Anesthesia Care Recommendation:           - Patient has a contact number  available for                            emergencies. The signs and symptoms of potential                            delayed complications were discussed with the                            patient. Return to normal activities tomorrow.                            Written discharge instructions were provided to the                            patient.                           - Await pathology results.                           - Resume previous diet.                           - Continue present medications. Procedure Code(s):        --- Professional ---                           [phone], Esophagogastroduodenoscopy, flexible,                            transoral; with biopsy, single or multiple Diagnosis  Code(s):        --- Professional ---                           K31.7, Polyp of stomach and duodenum                           R12, Heartburn CPT copyright 2016 [employer]. All rights reserved. The codes documented in this report are preliminary and upon coder review may  be revised to meet current compliance requirements. Earle Gell, MD Garlan Fair, MD 11/09/2015 12:59:27 PM This report has been signed electronically. Number of Addenda: 0

## 2015-11-10 ENCOUNTER — Encounter (HOSPITAL_COMMUNITY): Payer: Self-pay | Admitting: Gastroenterology

## 2016-02-05 ENCOUNTER — Inpatient Hospital Stay (HOSPITAL_COMMUNITY): Payer: Managed Care, Other (non HMO)

## 2016-02-05 ENCOUNTER — Other Ambulatory Visit: Payer: Self-pay | Admitting: Nurse Practitioner

## 2016-02-05 ENCOUNTER — Other Ambulatory Visit: Payer: Self-pay

## 2016-02-05 ENCOUNTER — Encounter (HOSPITAL_COMMUNITY): Payer: Self-pay | Admitting: *Deleted

## 2016-02-05 ENCOUNTER — Inpatient Hospital Stay (HOSPITAL_COMMUNITY)
Admission: EM | Admit: 2016-02-05 | Discharge: 2016-02-08 | DRG: 683 | Disposition: A | Discharge status: Home / Self Care | Payer: Managed Care, Other (non HMO) | Attending: Internal Medicine | Admitting: Internal Medicine

## 2016-02-05 DIAGNOSIS — G4733 Obstructive sleep apnea (adult) (pediatric): Secondary | ICD-10-CM | POA: Diagnosis present

## 2016-02-05 DIAGNOSIS — R0602 Shortness of breath: Secondary | ICD-10-CM | POA: Diagnosis not present

## 2016-02-05 DIAGNOSIS — R1011 Right upper quadrant pain: Secondary | ICD-10-CM

## 2016-02-05 DIAGNOSIS — M79609 Pain in unspecified limb: Secondary | ICD-10-CM | POA: Diagnosis not present

## 2016-02-05 DIAGNOSIS — T39395A Adverse effect of other nonsteroidal anti-inflammatory drugs [NSAID], initial encounter: Secondary | ICD-10-CM | POA: Diagnosis present

## 2016-02-05 DIAGNOSIS — N289 Disorder of kidney and ureter, unspecified: Secondary | ICD-10-CM | POA: Diagnosis not present

## 2016-02-05 DIAGNOSIS — D649 Anemia, unspecified: Secondary | ICD-10-CM | POA: Diagnosis present

## 2016-02-05 DIAGNOSIS — Z6838 Body mass index (BMI) 38.0-38.9, adult: Secondary | ICD-10-CM

## 2016-02-05 DIAGNOSIS — Z79899 Other long term (current) drug therapy: Secondary | ICD-10-CM

## 2016-02-05 DIAGNOSIS — I10 Essential (primary) hypertension: Secondary | ICD-10-CM | POA: Diagnosis present

## 2016-02-05 DIAGNOSIS — N170 Acute kidney failure with tubular necrosis: Secondary | ICD-10-CM

## 2016-02-05 DIAGNOSIS — K5900 Constipation, unspecified: Secondary | ICD-10-CM | POA: Diagnosis present

## 2016-02-05 DIAGNOSIS — H538 Other visual disturbances: Secondary | ICD-10-CM | POA: Diagnosis present

## 2016-02-05 DIAGNOSIS — E872 Acidosis: Secondary | ICD-10-CM | POA: Diagnosis present

## 2016-02-05 DIAGNOSIS — E119 Type 2 diabetes mellitus without complications: Secondary | ICD-10-CM | POA: Diagnosis present

## 2016-02-05 DIAGNOSIS — E875 Hyperkalemia: Secondary | ICD-10-CM | POA: Diagnosis not present

## 2016-02-05 DIAGNOSIS — Z7982 Long term (current) use of aspirin: Secondary | ICD-10-CM

## 2016-02-05 DIAGNOSIS — E860 Dehydration: Secondary | ICD-10-CM | POA: Diagnosis present

## 2016-02-05 DIAGNOSIS — I959 Hypotension, unspecified: Secondary | ICD-10-CM | POA: Diagnosis present

## 2016-02-05 DIAGNOSIS — N179 Acute kidney failure, unspecified: Principal | ICD-10-CM | POA: Diagnosis present

## 2016-02-05 DIAGNOSIS — R000 Tachycardia, unspecified: Secondary | ICD-10-CM | POA: Diagnosis present

## 2016-02-05 DIAGNOSIS — R112 Nausea with vomiting, unspecified: Secondary | ICD-10-CM

## 2016-02-05 DIAGNOSIS — Z833 Family history of diabetes mellitus: Secondary | ICD-10-CM

## 2016-02-05 DIAGNOSIS — I248 Other forms of acute ischemic heart disease: Secondary | ICD-10-CM | POA: Diagnosis present

## 2016-02-05 DIAGNOSIS — E669 Obesity, unspecified: Secondary | ICD-10-CM | POA: Diagnosis present

## 2016-02-05 DIAGNOSIS — Z7984 Long term (current) use of oral hypoglycemic drugs: Secondary | ICD-10-CM

## 2016-02-05 DIAGNOSIS — G8929 Other chronic pain: Secondary | ICD-10-CM | POA: Diagnosis present

## 2016-02-05 DIAGNOSIS — R072 Precordial pain: Secondary | ICD-10-CM | POA: Diagnosis not present

## 2016-02-05 DIAGNOSIS — E0500 Thyrotoxicosis with diffuse goiter without thyrotoxic crisis or storm: Secondary | ICD-10-CM | POA: Diagnosis present

## 2016-02-05 DIAGNOSIS — H539 Unspecified visual disturbance: Secondary | ICD-10-CM

## 2016-02-05 HISTORY — DX: Hypotension, unspecified: I95.9

## 2016-02-05 HISTORY — DX: Acute kidney failure with tubular necrosis: N17.0

## 2016-02-05 HISTORY — DX: Acute kidney failure, unspecified: N17.9

## 2016-02-05 LAB — COMPREHENSIVE METABOLIC PANEL
ALT: 17 U/L (ref 17–63)
AST: 19 U/L (ref 15–41)
Albumin: 4.3 g/dL (ref 3.5–5.0)
Alkaline Phosphatase: 54 U/L (ref 38–126)
Anion gap: 17 — ABNORMAL HIGH (ref 5–15)
BILIRUBIN TOTAL: 0.9 mg/dL (ref 0.3–1.2)
BUN: 78 mg/dL — AB (ref 6–20)
CALCIUM: 8.2 mg/dL — AB (ref 8.9–10.3)
CO2: 21 mmol/L — ABNORMAL LOW (ref 22–32)
CREATININE: 9.28 mg/dL — AB (ref 0.61–1.24)
Chloride: 96 mmol/L — ABNORMAL LOW (ref 101–111)
GFR calc Af Amer: 7 mL/min — ABNORMAL LOW (ref >60)
GFR, EST NON AFRICAN AMERICAN: 6 mL/min — AB (ref >60)
Glucose, Bld: 98 mg/dL (ref 65–99)
POTASSIUM: 6.8 mmol/L — AB (ref 3.5–5.1)
Sodium: 134 mmol/L — ABNORMAL LOW (ref 135–145)
TOTAL PROTEIN: 6.6 g/dL (ref 6.5–8.1)

## 2016-02-05 LAB — I-STAT CG4 LACTIC ACID, ED
LACTIC ACID, VENOUS: 1.79 mmol/L (ref 0.5–1.9)
LACTIC ACID, VENOUS: 4.15 mmol/L — AB (ref 0.5–1.9)

## 2016-02-05 LAB — BASIC METABOLIC PANEL
Anion gap: 17 — ABNORMAL HIGH (ref 5–15)
BUN: 77 mg/dL — AB (ref 6–20)
CHLORIDE: 99 mmol/L — AB (ref 101–111)
CO2: 17 mmol/L — AB (ref 22–32)
CREATININE: 8.92 mg/dL — AB (ref 0.61–1.24)
Calcium: 7.4 mg/dL — ABNORMAL LOW (ref 8.9–10.3)
GFR calc Af Amer: 7 mL/min — ABNORMAL LOW (ref >60)
GFR calc non Af Amer: 6 mL/min — ABNORMAL LOW (ref >60)
Glucose, Bld: 155 mg/dL — ABNORMAL HIGH (ref 65–99)
Potassium: 5.2 mmol/L — ABNORMAL HIGH (ref 3.5–5.1)
Sodium: 133 mmol/L — ABNORMAL LOW (ref 135–145)

## 2016-02-05 LAB — CBC
HEMATOCRIT: 38.3 % — AB (ref 39.0–52.0)
Hemoglobin: 12.6 g/dL — ABNORMAL LOW (ref 13.0–17.0)
MCH: 30.4 pg (ref 26.0–34.0)
MCHC: 32.9 g/dL (ref 30.0–36.0)
MCV: 92.5 fL (ref 78.0–100.0)
PLATELETS: 274 10*3/uL (ref 150–400)
RBC: 4.14 MIL/uL — ABNORMAL LOW (ref 4.22–5.81)
RDW: 13.2 % (ref 11.5–15.5)
WBC: 11 10*3/uL — AB (ref 4.0–10.5)

## 2016-02-05 LAB — GLUCOSE, CAPILLARY
GLUCOSE-CAPILLARY: 137 mg/dL — AB (ref 65–99)
GLUCOSE-CAPILLARY: 151 mg/dL — AB (ref 65–99)

## 2016-02-05 LAB — LIPASE, BLOOD: Lipase: 44 U/L (ref 11–51)

## 2016-02-05 LAB — MRSA PCR SCREENING: MRSA by PCR: NEGATIVE

## 2016-02-05 LAB — PHOSPHORUS: PHOSPHORUS: 11 mg/dL — AB (ref 2.5–4.6)

## 2016-02-05 LAB — CK: CK TOTAL: 356 U/L (ref 49–397)

## 2016-02-05 LAB — PROCALCITONIN: Procalcitonin: 0.3 ng/mL

## 2016-02-05 MED ORDER — SODIUM CHLORIDE 0.9 % IV SOLN
INTRAVENOUS | Status: DC
  Administered 2016-02-05: 21:00:00 via INTRAVENOUS

## 2016-02-05 MED ORDER — SODIUM CHLORIDE 0.9 % IV SOLN
1.0000 g | Freq: Once | INTRAVENOUS | Status: AC
  Administered 2016-02-05: 1 g via INTRAVENOUS
  Filled 2016-02-05: qty 10

## 2016-02-05 MED ORDER — DEXTROSE 50 % IV SOLN
1.0000 | Freq: Once | INTRAVENOUS | Status: AC
  Administered 2016-02-05: 50 mL via INTRAVENOUS
  Filled 2016-02-05: qty 50

## 2016-02-05 MED ORDER — LEVOTHYROXINE SODIUM 75 MCG PO TABS
150.0000 ug | ORAL_TABLET | Freq: Every day | ORAL | Status: DC
  Administered 2016-02-06 – 2016-02-08 (×3): 150 ug via ORAL
  Filled 2016-02-05: qty 1
  Filled 2016-02-05 (×2): qty 2

## 2016-02-05 MED ORDER — ONDANSETRON HCL 4 MG/2ML IJ SOLN
4.0000 mg | Freq: Three times a day (TID) | INTRAMUSCULAR | Status: AC | PRN
  Administered 2016-02-06: 4 mg via INTRAVENOUS
  Filled 2016-02-05 (×3): qty 2

## 2016-02-05 MED ORDER — INSULIN ASPART 100 UNIT/ML ~~LOC~~ SOLN
0.0000 [IU] | SUBCUTANEOUS | Status: DC
  Administered 2016-02-05: 2 [IU] via SUBCUTANEOUS
  Administered 2016-02-06 – 2016-02-07 (×4): 1 [IU] via SUBCUTANEOUS

## 2016-02-05 MED ORDER — SODIUM CHLORIDE 0.9 % IV BOLUS (SEPSIS)
1000.0000 mL | Freq: Once | INTRAVENOUS | Status: AC
  Administered 2016-02-05: 1000 mL via INTRAVENOUS

## 2016-02-05 MED ORDER — SODIUM CHLORIDE 0.9 % IV SOLN
INTRAVENOUS | Status: DC
  Administered 2016-02-05 – 2016-02-06 (×2): via INTRAVENOUS

## 2016-02-05 MED ORDER — SODIUM POLYSTYRENE SULFONATE 15 GM/60ML PO SUSP: 15.0000 g | Freq: Once | ORAL | Status: DC

## 2016-02-05 MED ORDER — HEPARIN SODIUM (PORCINE) 5000 UNIT/ML IJ SOLN
5000.0000 [IU] | Freq: Three times a day (TID) | INTRAMUSCULAR | Status: DC
  Administered 2016-02-05 – 2016-02-07 (×6): 5000 [IU] via SUBCUTANEOUS
  Filled 2016-02-05 (×7): qty 1

## 2016-02-05 MED ORDER — SODIUM POLYSTYRENE SULFONATE 15 GM/60ML PO SUSP
60.0000 g | Freq: Once | ORAL | Status: DC
  Filled 2016-02-05: qty 240

## 2016-02-05 MED ORDER — SODIUM CHLORIDE 0.9 % IV SOLN: 250.0000 mL | INTRAVENOUS | Status: DC | PRN

## 2016-02-05 MED ORDER — FENTANYL CITRATE (PF) 100 MCG/2ML IJ SOLN
12.5000 ug | Freq: Once | INTRAMUSCULAR | Status: AC
  Administered 2016-02-05: 12.5 ug via INTRAVENOUS
  Filled 2016-02-05: qty 2

## 2016-02-05 MED ORDER — SODIUM POLYSTYRENE SULFONATE 15 GM/60ML PO SUSP
15.0000 g | Freq: Once | ORAL | Status: AC
  Administered 2016-02-05: 15 g via ORAL
  Filled 2016-02-05: qty 60

## 2016-02-05 MED ORDER — INSULIN ASPART 100 UNIT/ML IV SOLN
5.0000 [IU] | Freq: Once | INTRAVENOUS | Status: AC
  Administered 2016-02-05: 5 [IU] via INTRAVENOUS
  Filled 2016-02-05: qty 1

## 2016-02-05 MED ORDER — ALBUTEROL (5 MG/ML) CONTINUOUS INHALATION SOLN
10.0000 mg/h | INHALATION_SOLUTION | Freq: Once | RESPIRATORY_TRACT | Status: AC
  Administered 2016-02-05: 10 mg/h via RESPIRATORY_TRACT
  Filled 2016-02-05: qty 20

## 2016-02-05 NOTE — ED Notes (Addendum)
Bladder scan showed 40 mL

## 2016-02-05 NOTE — ED Notes (Signed)
Patient reports that he has not been able to urinate in 2 days.  Tried to this am with only dribbles to report of.  MD Audie Pinto asked for bladder scan to determine if urine is being produced.

## 2016-02-05 NOTE — ED Notes (Signed)
Spoke with EMTs and other nurses to find bladder scanner and no one can locate at this time.  Beaton MD used ultrasound to rule out urinary retention.,  Upon examination he discovered no urine.  Will bladder scan once it is available

## 2016-02-05 NOTE — ED Notes (Signed)
Spoke with ICU admitting about patients BP.  Manual was 82/50.  MD requested I start 105mL bolus.  Order will follow.  Will continue to monitor

## 2016-02-05 NOTE — ED Notes (Signed)
Dr. Ellender Hose aware of pts potassium.

## 2016-02-05 NOTE — ED Triage Notes (Addendum)
Pt reports going to pcp today for abd pain and constipation. Had blood work done and sent here for abnormal labs and renal failure. Also reports vision changes x 2 days. Denies hx of renal disease. Unsure of exact lab values but reports potassium was elevated.

## 2016-02-05 NOTE — ED Notes (Signed)
Upon arrival to ICU patient had made 45 mL of urine on the trip upstairs.  Update given to [employer]. RN

## 2016-02-05 NOTE — ED Provider Notes (Signed)
[place] DEPT Provider Note   CSN: [id number] Arrival date & time: 02/05/16  1342  First Provider Contact:  None       History   Chief Complaint Chief Complaint  Patient presents with  . Abnormal Lab    HPI Scott Simmons is a 50 y.o. male who presents to ED for evaluation of abnormal labs. The patient reports he went to his PCPs office today because he's been constipated and had blurred vision for the past 2 days. Per pt they ran labs, was told he had acute renal failure and was told to go to the ED for evaluation.  In regards to his vision, he reports that when he goes outside everything "looks white" and his vision has been blurry x2 days. The patient reports that his last BM was yesterday morning but before that his last BM was 5 days prior. He states this is abnormal for him. Denies any abdominal surgeries. The patient also reports that he has had decreased urine output and hasn't had more than a few drops x3 days. He denies any burning with urination but reports urgency.  Patient denies any history of similar episodes and denies any family history of renal disease.   HPI  Past Medical History:  Diagnosis Date  . Diabetes ([place])   . Hypertension   . Lumbar disc disease    slipped disc  . Sleep apnea    cpap broke  . Slipped cervical disc     Patient Active Problem List   Diagnosis Date Noted  . Tachycardia 07/18/2014  . Type 2 diabetes mellitus ([place]) 07/18/2014  . Essential hypertension 03/19/2014  . Chest pain 03/19/2014    Past Surgical History:  Procedure Laterality Date  . COLONOSCOPY WITH PROPOFOL N/A 11/09/2015   Procedure: COLONOSCOPY WITH PROPOFOL;  Surgeon: Garlan Fair, MD;  Location: [facility] ENDOSCOPY;  Service: Endoscopy;  Laterality: N/A;  . ESOPHAGOGASTRODUODENOSCOPY (EGD) WITH PROPOFOL N/A 11/09/2015   Procedure: ESOPHAGOGASTRODUODENOSCOPY (EGD) WITH PROPOFOL;  Surgeon: Garlan Fair, MD;  Location: [facility] ENDOSCOPY;  Service: Endoscopy;   Laterality: N/A;  . KNEE SURGERY Left    acl repair  . SHOULDER SURGERY Right    arthroscopic       Home Medications    Prior to Admission medications   Medication Sig Start Date End Date Taking? Authorizing Provider  ALPRAZolam Duanne Moron) 0.5 MG tablet Take 0.5 mg by mouth 2 (two) times daily as needed for anxiety.  05/09/14  Yes Historical Provider, MD  aspirin EC 81 MG tablet Take 81 mg by mouth daily.   Yes Historical Provider, MD  celecoxib (CELEBREX) 200 MG capsule Take 200 mg by mouth daily as needed (For pain.).  02/27/14  Yes Historical Provider, MD  gabapentin (NEURONTIN) 300 MG capsule Take 300 mg by mouth at bedtime. 10/13/15  Yes Historical Provider, MD  glipiZIDE (GLUCOTROL) 5 MG tablet Take 5 mg by mouth 2 (two) times daily.  02/01/14  Yes Historical Provider, MD  ibuprofen (ADVIL,MOTRIN) 200 MG tablet Take 800 mg by mouth every 6 (six) hours as needed (For pain.).   Yes Historical Provider, MD  levothyroxine (SYNTHROID, LEVOTHROID) 150 MCG tablet Take 150 mcg by mouth daily before breakfast.   Yes Historical Provider, MD  lisinopril (PRINIVIL,ZESTRIL) 20 MG tablet Take 20 mg by mouth daily. 10/10/15  Yes Historical Provider, MD  Multiple Vitamin (MULTIVITAMIN) capsule Take 1 capsule by mouth daily.   Yes Historical Provider, MD  Omega-3 Fatty Acids (FISH OIL PO) Take  1 capsule by mouth 3 (three) times daily.    Yes Historical Provider, MD  pioglitazone-metformin (ACTOPLUS MET) 15-850 MG per tablet Take 1 tablet by mouth 2 (two) times daily. 02/27/14  Yes Historical Provider, MD  sertraline (ZOLOFT) 50 MG tablet Take 50 mg by mouth daily.  03/11/14  Yes Historical Provider, MD  SUBOXONE 8-2 MG FILM Take 1 Film by mouth 2 (two) times daily.  03/03/14  Yes Historical Provider, MD  testosterone cypionate (DEPOTESTOTERONE CYPIONATE) 200 MG/ML injection Inject 200 mg into the muscle every 14 (fourteen) days.    Yes Historical Provider, MD  tiZANidine (ZANAFLEX) 4 MG capsule Take 4 mg by  mouth 3 (three) times daily as needed for muscle spasms.  12/23/13  Yes Historical Provider, MD  TRULICITY 1.5 TH/4.3OO SOPN Inject 0.5 mLs into the skin every Monday. 10/11/15   Historical Provider, MD  VIAGRA 100 MG tablet Take 100 mg by mouth daily as needed for erectile dysfunction.  12/27/13   Historical Provider, MD    Family History Family History  Problem Relation Age of Onset  . Stroke Paternal Grandmother   . Diabetes Paternal Grandmother   . Lung cancer Paternal Grandfather   . Stroke Paternal Grandfather   . Diabetes Paternal Grandfather   . Thyroid cancer Brother   . Heart attack Brother   . Mental retardation Other     half-brother x2    Social History Social History  Substance Use Topics  . Smoking status: Never Smoker  . Smokeless tobacco: Never Used  . Alcohol use No     Allergies   Review of patient's allergies indicates no known allergies.   Review of Systems Review of Systems  Constitutional: Negative for chills and fever.  Eyes: Positive for photophobia and visual disturbance.       Positive for blurred vision.  Respiratory: Negative for cough and shortness of breath.   Cardiovascular: Negative for chest pain and palpitations.  Gastrointestinal: Positive for abdominal pain, constipation, nausea and vomiting. Negative for abdominal distention, blood in stool and diarrhea.  Genitourinary: Positive for difficulty urinating and urgency. Negative for flank pain.  Musculoskeletal: Positive for back pain. Negative for neck pain and neck stiffness.  Skin: Negative for rash.  Neurological: Negative for seizures, syncope and numbness.  Psychiatric/Behavioral: Negative for confusion.     Physical Exam Updated Vital Signs BP 92/57 (BP Location: Right Arm)   Pulse 79   Temp 98.5 F (36.9 C) (Oral)   Resp 12   SpO2 97%   Physical Exam  Constitutional: He is oriented to person, place, and time. He appears well-developed and well-nourished. No distress.    HENT:  Head: Normocephalic and atraumatic.  Eyes: Pupils are equal, round, and reactive to light.  Cardiovascular: Normal rate and regular rhythm.   Pulmonary/Chest: Effort normal and breath sounds normal.  Abdominal: Soft. Bowel sounds are normal. He exhibits no distension. There is no rigidity and no rebound.  Patient complains of tenderness with deep palpation.   Neurological: He is alert and oriented to person, place, and time.  Skin: Skin is warm and dry.     ED Treatments / Results  Labs (all labs ordered are listed, but only abnormal results are displayed) Labs Reviewed  COMPREHENSIVE METABOLIC PANEL - Abnormal; Notable for the following:       Result Value   Sodium 134 (*)    Potassium 6.8 (*)    Chloride 96 (*)    CO2 21 (*)  BUN 78 (*)    Creatinine, Ser 9.28 (*)    Calcium 8.2 (*)    GFR calc non Af Amer 6 (*)    GFR calc Af Amer 7 (*)    Anion gap 17 (*)    All other components within normal limits  CBC - Abnormal; Notable for the following:    WBC 11.0 (*)    RBC 4.14 (*)    Hemoglobin 12.6 (*)    HCT 38.3 (*)    All other components within normal limits  LIPASE, BLOOD  URINALYSIS, ROUTINE W REFLEX MICROSCOPIC (NOT AT [facility])    EKG  EKG Interpretation  Date/Time:  Friday February 05 2016 13:56:48 EDT Ventricular Rate:  90 PR Interval:  172 QRS Duration: 100 QT Interval:  334 QTC Calculation: 408 R Axis:   76 Text Interpretation:  Normal sinus rhythm Abnormal QRS-T angle, consider primary T wave abnormality Abnormal ECG Confirmed by Hazle Coca [phone]) on 02/05/2016 2:02:06 PM       Radiology No results found.  Procedures Procedures (including critical care time)  Medications Ordered in ED Medications  sodium polystyrene (KAYEXALATE) 15 GM/60ML suspension 15 g (not administered)  0.9 %  sodium chloride infusion ( Intravenous New Bag/Given 02/05/16 1620)  dextrose 50 % solution 50 mL (50 mLs Intravenous Given 02/05/16 1521)  insulin aspart  (novoLOG) injection 5 Units (5 Units Intravenous Given 02/05/16 1522)  calcium gluconate 1 g in sodium chloride 0.9 % 100 mL IVPB (0 g Intravenous Stopped 02/05/16 1551)  albuterol (PROVENTIL,VENTOLIN) solution continuous neb (10 mg/hr Nebulization Given 02/05/16 1511)  sodium polystyrene (KAYEXALATE) 15 GM/60ML suspension 15 g (15 g Oral Given 02/05/16 1709)  sodium chloride 0.9 % bolus 1,000 mL (1,000 mLs Intravenous New Bag/Given 02/05/16 1550)     Initial Impression / Assessment and Plan / ED Course  I have reviewed the triage vital signs and the nursing notes.  Pertinent labs & imaging results that were available during my care of the patient were reviewed by me and considered in my medical decision making (see chart for details).  Clinical Course  Comment By Time  I spoke with nephrology, [place], who will see patient and recommended 50 g kayexalate along with Ca,insulin, glucose,  Leonard Schwartz, MD 07/28 1518    Bedside bladder US did not show urinary retention.  Patient continues to remain hypotensive despite 2L saline. Patient alert and communicating during ED stay.  Case discussed with nephrology who agreed to see pt and recommended 50g Kayexalate, insulin, glucose and Ca.   Case discussed with hospitalist group who accepted admission.  Final Clinical Impressions(s) / ED Diagnoses   Final diagnoses:  AKI (acute kidney injury) ([place])  Hyperkalemia    New Prescriptions New Prescriptions   No medications on file     Demonte Dobratz, DO 02/05/16 2128    Leonard Schwartz, MD 02/11/16 [phone]

## 2016-02-05 NOTE — ED Notes (Signed)
Spoke with Charge nurse about foley placement under the criteria tree.  Audie Pinto MD states that he will take responsibility for placement of this foley here in the ED.  Foley will be place here with order from the MD

## 2016-02-05 NOTE — ED Notes (Signed)
After placement of foley there was minimal urine return, not enough to send for analysis.  Will continue to monitor.

## 2016-02-05 NOTE — ED Notes (Signed)
Urology and Audie Pinto MD want foley catheter

## 2016-02-05 NOTE — ED Notes (Signed)
Patient states that he feels like something broke through in his lower back.  Patient has started to have urine in his foley bag.  Output of 20 mL at this time

## 2016-02-05 NOTE — Consult Note (Signed)
Reason for Consult: AKI Referring Physician: Dr. Audie Pinto (EDP)  HPI: Scott Simmons is an 50 y.o. Male with a past medical history of non-insulin dependant diabetes type 2, hypertension presenting from PCP office after labs showed AKI with Cr of 9.2 and hyperkalemia. Patient reports that he has been constipated for several weeks and make an appointment to see his PCP. He has been having episodes of blurry vision for the past several days as well. States when he goes outside everything 'goes white' and he cannot see anything. He has not noticed any decrease in urine output until this morning. Reports when he went to urinate today he had urgency but was only able to void a few drops. Denies any dysuria or hematuria. Reports no history of kidney problems. Denies any flank pain. Notes having nausea and vomiting last week but was told it was from food poisoning. Had another episode of nausea and vomiting 2 days ago and again this morning when he woke up. 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 Does reports dizziness/lightheadness with standing the past couple of days as well as new headaches. Denies any chest pain but does not a couple episodes of palpations this past week. Has been feeling tired with muscles aches this week. Reports taking either Ibuprofen 800 mg bid or Celebrex daily. No change in does recently, has been taking for years. Less urine for several days.  No D, and at most V x 4.  Not excessively hot at work or xs sweating.   Past Medical History:  Diagnosis Date  . Diabetes ([place])   . Hypertension   . Lumbar disc disease    slipped disc  . Sleep apnea    cpap broke  . Slipped cervical disc     Past Surgical History:  Procedure Laterality Date  . COLONOSCOPY WITH PROPOFOL N/A 11/09/2015   Procedure: COLONOSCOPY WITH PROPOFOL;  Surgeon: Garlan Fair, MD;  Location: [facility];  Service: Endoscopy;  Laterality: N/A;  . ESOPHAGOGASTRODUODENOSCOPY (EGD) WITH PROPOFOL  N/A 11/09/2015   Procedure: ESOPHAGOGASTRODUODENOSCOPY (EGD) WITH PROPOFOL;  Surgeon: Garlan Fair, MD;  Location: [facility];  Service: Endoscopy;  Laterality: N/A;  . KNEE SURGERY Left    acl repair  . SHOULDER SURGERY Right    arthroscopic    Family History  Problem Relation Age of Onset  . Stroke Paternal Grandmother   . Diabetes Paternal Grandmother   . Lung cancer Paternal Grandfather   . Stroke Paternal Grandfather   . Diabetes Paternal Grandfather   . Thyroid cancer Brother   . Heart attack Brother   . Mental retardation Other     half-brother x2    Social History:  reports that he has never smoked. He has never used smokeless tobacco. He reports that he does not drink alcohol or use drugs.  Allergies: No Known Allergies  Medications: I have reviewed the patient's current medications.  Results for orders placed or performed during the hospital encounter of 02/05/16 (from the past 48 hour(s))  Lipase, blood     Status: None   Collection Time: 02/05/16  2:00 PM  Result Value Ref Range   Lipase 44 11 - 51 U/L  Comprehensive metabolic panel     Status: Abnormal   Collection Time: 02/05/16  2:00 PM  Result Value Ref Range   Sodium 134 (L) 135 - 145 mmol/L   Potassium 6.8 (HH) 3.5 - 5.1 mmol/L    Comment: NO VISIBLE HEMOLYSIS CRITICAL RESULT  CALLED TO, READ BACK BY AND VERIFIED WITH: LINDSAY BISHOP,RN AT 2694 02/05/16 BY ZBEECH.    Chloride 96 (L) 101 - 111 mmol/L   CO2 21 (L) 22 - 32 mmol/L   Glucose, Bld 98 65 - 99 mg/dL   BUN 78 (H) 6 - 20 mg/dL   Creatinine, Ser 9.28 (H) 0.61 - 1.24 mg/dL   Calcium 8.2 (L) 8.9 - 10.3 mg/dL   Total Protein 6.6 6.5 - 8.1 g/dL   Albumin 4.3 3.5 - 5.0 g/dL   AST 19 15 - 41 U/L   ALT 17 17 - 63 U/L   Alkaline Phosphatase 54 38 - 126 U/L   Total Bilirubin 0.9 0.3 - 1.2 mg/dL   GFR calc non Af Amer 6 (L) >60 mL/min   GFR calc Af Amer 7 (L) >60 mL/min    Comment: (NOTE) The eGFR has been calculated using the CKD EPI  equation. This calculation has not been validated in all clinical situations. eGFR's persistently <60 mL/min signify possible Chronic Kidney Disease.    Anion gap 17 (H) 5 - 15  CBC     Status: Abnormal   Collection Time: 02/05/16  2:00 PM  Result Value Ref Range   WBC 11.0 (H) 4.0 - 10.5 K/uL   RBC 4.14 (L) 4.22 - 5.81 MIL/uL   Hemoglobin 12.6 (L) 13.0 - 17.0 g/dL   HCT 38.3 (L) 39.0 - 52.0 %   MCV 92.5 78.0 - 100.0 fL   MCH 30.4 26.0 - 34.0 pg   MCHC 32.9 30.0 - 36.0 g/dL   RDW 13.2 11.5 - 15.5 %   Platelets 274 150 - 400 K/uL  I-Stat CG4 Lactic Acid, ED     Status: None   Collection Time: 02/05/16  4:09 PM  Result Value Ref Range   Lactic Acid, Venous 1.79 0.5 - 1.9 mmol/L    No results found.  Review of Systems  Constitutional: Positive for malaise/fatigue.  Eyes: Positive for blurred vision.  Respiratory: Negative for shortness of breath.   Cardiovascular: Positive for palpitations. Negative for chest pain.  Gastrointestinal: Positive for abdominal pain, constipation, nausea and vomiting. Negative for blood in stool, diarrhea and melena.  Genitourinary: Positive for urgency. Negative for dysuria, flank pain, frequency and hematuria.  Musculoskeletal: Positive for myalgias.  Neurological: Positive for dizziness, weakness and headaches.   Blood pressure (!) 88/72, pulse (!) 122, temperature 98.5 F (36.9 C), temperature source Oral, resp. rate (!) 27, SpO2 99 %. Physical Exam  General: alert, well-developed, and cooperative to examination.  Head: normocephalic and atraumatic.  Eyes: vision grossly intact, pupils equal, pupils round, pupils reactive to light, no injection and anicteric. Fundi benign Mouth: pharynx pink and moist, no erythema, and no exudates.  Neck: supple, full ROM, no thyromegaly, no JVD, and no carotid bruits. PCL only Lungs: normal respiratory effort, no accessory muscle use, normal breath sounds, no crackles, and no wheezes. Heart: tachycardic,  regular rhythm, no murmur, no gallop, and no rub. Gr 3/6 SEM Abdomen: soft, non-tender, normal bowel sounds, no distention, no guarding, no rebound tenderness Obese Msk: no joint swelling, no joint warmth, and no redness over joints.  Pulses: 2+ DP/PT pulses bilaterally Extremities: No cyanosis, clubbing, edema Neurologic: alert & oriented X3, no focal deficits Skin: turgor normal and no rashes.  Psych: normal mood and affect  Assessment/Plan: 1 AKI - Patient with Cr of 9.2 on arrival. Prior baseline was 0.69 in 08/2014. Patient with decreased UOP for the past 24 hours.  Most likely from NSAID and ACE-I use in the setting of dehydration but cannot rule out obstruction or other more rare etiologies. Will need aggressive IVF with his hypotension. Admit to step down unit with close monitoring. May require dialysis if no improvement.  -Renal US -U/A -Urine creatinine, Urine sodium, Urine osms -Insert foley, monitor I&O -Repeat bmet at 1800 -Aggressive IVF, 2L bolus and then run at 250 mL/hr -Lactate -CK  -Hold anti-hypertensives -Avoid nephrotoxic agetns 2. Hyperkalemia - K 6.8 on arrival. Received calcium gluconate, insulin, D50, albuterol and sodium bicarb. No chest pains or palpations currently. EKG with peaking T waves, no ST depression or shortened QT. -Kayexalate now, second dose later this evening -Recheck at 1800 3 Hypotension: Patients BP began to drop while in the ED. Into 73X-10G systolic. Patient with complaints of dizziness with standing.  -Giving 2L bolus then 250 mL/hr -If unable to support BP with fluids will need PCCM consult for pressors 4. DM  5. Hypothyroid 6 obesity  Maryellen Pile 02/05/2016, 4:23 PM  I have seen and examined this patient and agree with the plan of care seen , eval, examined, discussed with primary, resident, and counseled.   Basically 50 yr male with relatively short illness with nondescript sx on lightheadness, V - limited, constip, and weakeness.   Late in course low urine output.  Concern of drug induced injury in setting of vol depletion (ie NSAID, ACEI) but not classical hx.  Main finding on exam low bp and main lab finding ^^K, with mild acidemia .  Tx and F/U as above. .  Ovidio Steele L 02/05/2016, 5:54 PM

## 2016-02-05 NOTE — H&P (Signed)
PULMONARY / CRITICAL CARE MEDICINE   Name: Scott Simmons MRN: [id number] DOB: 05/15/1966    ADMISSION DATE:  02/05/2016 CONSULTATION DATE:  02/05/16  REFERRING MD:  Dr. Jimmy Footman and EDP  CHIEF COMPLAINT:  weakness  HISTORY OF PRESENT ILLNESS:   Scott Simmons is an 50 y.o. Male with a past medical history of non-insulin dependant diabetes type 2, hypertension presenting from PCP office after labs showed AKI with Cr of 9.2 and hyperkalemia. Patient reports that he has been constipated for several weeks and make an appointment to see his PCP. He has been having episodes of blurry vision for the past several days as well. He has not been feeling well for the last 1-2 weeks, has not been eating much as well, continues to take his lisinopril, and has also been taking NSAIDS 2/2 gend pain/weakness.   He has been working until 7/27. He felt dizzy today so he ended up in the ED.  Initially BP was 140/110 (likely erroneous) then eventually declined to 35-36 systolic with a HR of 144. He was seen by renal service for AKI.  He is mentating well. Foley placed and no u.o. K was 6.8 and he has gotten meds.  BP remained 80 systolic with HR in 315Q so PCCM was asked to admit to ICU.  By the time I saw pt, he was comfortable, mentating well, some vague chest discomfort, denies SOB. I have ordered 3rd L NS to be hung.    PAST MEDICAL HISTORY :  He  has a past medical history of Diabetes ([place]); Hypertension; Hypotension; Lumbar disc disease; Sleep apnea; and Slipped cervical disc.  Thyroid problem > has Graves dse > had radiation > now on thyroid supplement OSA > not on cpap > non compliant.   PAST SURGICAL HISTORY: He  has a past surgical history that includes Shoulder surgery (Right); Knee surgery (Left); Colonoscopy with propofol (N/A, 11/09/2015); and Esophagogastroduodenoscopy (egd) with propofol (N/A, 11/09/2015).  No Known Allergies  No current facility-administered medications on file prior to  encounter.    Current Outpatient Prescriptions on File Prior to Encounter  Medication Sig  . ALPRAZolam (XANAX) 0.5 MG tablet Take 0.5 mg by mouth 2 (two) times daily as needed for anxiety.   Marland Kitchen aspirin EC 81 MG tablet Take 81 mg by mouth daily.  . celecoxib (CELEBREX) 200 MG capsule Take 200 mg by mouth daily as needed (For pain.).   Marland Kitchen gabapentin (NEURONTIN) 300 MG capsule Take 300 mg by mouth at bedtime.  Marland Kitchen glipiZIDE (GLUCOTROL) 5 MG tablet Take 5 mg by mouth 2 (two) times daily.   Marland Kitchen ibuprofen (ADVIL,MOTRIN) 200 MG tablet Take 800 mg by mouth every 6 (six) hours as needed (For pain.).  Marland Kitchen lisinopril (PRINIVIL,ZESTRIL) 20 MG tablet Take 20 mg by mouth daily.  . Multiple Vitamin (MULTIVITAMIN) capsule Take 1 capsule by mouth daily.  . Omega-3 Fatty Acids (FISH OIL PO) Take 1 capsule by mouth 3 (three) times daily.   . pioglitazone-metformin (ACTOPLUS MET) 15-850 MG per tablet Take 1 tablet by mouth 2 (two) times daily.  . sertraline (ZOLOFT) 50 MG tablet Take 50 mg by mouth daily.   . SUBOXONE 8-2 MG FILM Take 1 Film by mouth 2 (two) times daily.   Marland Kitchen testosterone cypionate (DEPOTESTOTERONE CYPIONATE) 200 MG/ML injection Inject 200 mg into the muscle every 14 (fourteen) days.   Marland Kitchen tiZANidine (ZANAFLEX) 4 MG capsule Take 4 mg by mouth 3 (three) times daily as needed for muscle spasms.   Marland Kitchen  TRULICITY 1.5 DU/2.0UR SOPN Inject 0.5 mLs into the skin every Monday.  Marland Kitchen VIAGRA 100 MG tablet Take 100 mg by mouth daily as needed for erectile dysfunction.     FAMILY HISTORY:  His indicated that the status of his paternal grandmother is unknown. He indicated that the status of his paternal grandfather is unknown. He indicated that the status of his other is unknown.  He is adopted.   SOCIAL HISTORY: He  reports that he has never smoked. He has never used smokeless tobacco. He reports that he does not drink alcohol or use drugs.  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   REVIEW OF SYSTEMS:   Some gend  weakness, lethargy but some better now. Dizziness on standing. Very thirsty.  Vague chest discomfort. (-) fevers, chills, cough, abd pain, diarrhea. Some blurring of vision earlier today.   Earlier on the week, had nausea and vomiting Rest of 14 point ROS > (-) except for the ones mentioned above  VITAL SIGNS: BP 95/79   Pulse 116   Temp 98.5 F (36.9 C) (Oral)   Resp 11   SpO2 100%   HEMODYNAMICS:    VENTILATOR SETTINGS:    INTAKE / OUTPUT: No intake/output data recorded.  PHYSICAL EXAMINATION: General:  Awake, not in distress. Comfortable. Neuro:  CN grossly intact. (-) lateralizing signs.  HEENT:  PERLA, (-) NVD, (-) oral thrush.  Cardiovascular:  Tachycardic, good s1/s2. (-) m/r possible gallop Lungs:  Good ae, CTA. (-) accesory muscle use Abdomen:  Soft, NT, (-) masses/tenderness.  Musculoskeletal:  (-) edema/clubbing/cyanosis Skin:  Warm and dry. (-) rashes.   LABS:  BMET  Recent Labs Lab 02/05/16 1400  NA 134*  K 6.8*  CL 96*  CO2 21*  BUN 78*  CREATININE 9.28*  GLUCOSE 98    Electrolytes  Recent Labs Lab 02/05/16 1400  CALCIUM 8.2*  PHOS 11.0*    CBC  Recent Labs Lab 02/05/16 1400  WBC 11.0*  HGB 12.6*  HCT 38.3*  PLT 274    Coag's No results for input(s): APTT, INR in the last 168 hours.  Sepsis Markers  Recent Labs Lab 02/05/16 1609  LATICACIDVEN 1.79    ABG No results for input(s): PHART, PCO2ART, PO2ART in the last 168 hours.  Liver Enzymes  Recent Labs Lab 02/05/16 1400  AST 19  ALT 17  ALKPHOS 54  BILITOT 0.9  ALBUMIN 4.3    Cardiac Enzymes No results for input(s): TROPONINI, PROBNP in the last 168 hours.  Glucose No results for input(s): GLUCAP in the last 168 hours.  Imaging Dg Chest Port 1 View  Result Date: 02/05/2016 CLINICAL DATA:  Chest pain, nausea, blurred vision. EXAM: PORTABLE CHEST 1 VIEW COMPARISON:  06/27/2014 FINDINGS: Cardiomediastinal silhouette is normal. Mediastinal contours appear  intact. There is no evidence of focal airspace consolidation, pleural effusion or pneumothorax. Osseous structures are without acute abnormality. Soft tissues are grossly normal. IMPRESSION: No active disease. Electronically Signed   By: Fidela Salisbury M.D.   On: 02/05/2016 17:32    STUDIES:    CULTURES: MRSA 7/28 >   ANTIBIOTICS: None  SIGNIFICANT EVENTS: 7/28 admit for AKI  LINES/TUBES:   DISCUSSION: 50 year old male, known to have diabetes, thyroid disease, comes in with 1-2 week history of generalized weakness, lethargy, poor by mouth intake. He continued to take his lisinopril and was also taking NSAIDs for his pain. Had blurring of vision. Became dizzy on standing. At the emergency room, he was found to have acute kidney  injury with hyperkalemia. He was also found to be hypotensive in the 05W systolic, tachycardic in the 120s. Currently getting fluid boluses.  ASSESSMENT / PLAN:  PULMONARY A: OSA P:   Not tolerant to CPAP. Observe for OSA tonight. May need to start Bipap 12/5 for apneas sinus tachycardia tonight  CARDIOVASCULAR A:  Sinus tachycardia 2/2 Volume depletion Vague cp P:  Continue IVF Cycle troponins  RENAL A:   AKI 2/2 Volume depletion/Poor PO intake/ NSAID and ACE inhibitor use Hyperkalemia P:   Renal service has been consulted. On his 3rd liter bolus. I think he can tolerate a 4th L. WOF congestion. He received kayexalate, Ca gluc, insulin, and D50 per ED nurse > repeating BMET at 8 pm Avoiding HD Observe uo  GASTROINTESTINAL A:   No issues P:   Try renal diet tonight.   HEMATOLOGIC A:   (-) issues P:  observe  INFECTIOUS A:   No signs of infection now, Lactate N P:   Rpt lactate later. Check PCT  ENDOCRINE A:   Has graves dse Has DM P:   Insulin SS for now thyroxine  NEUROLOGIC A:   (-) issues P:   RASS goal: 0 Will observe   FAMILY  - Updates: Pt and wife updated at bedside.   - Inter-disciplinary family  meet or Palliative Care meeting due by:  Aug 4  Critical Care time spent on this patient was 30 minutes.  Admit to ICU tonight. If better in am, may transfer out to floors or SDU.   Monica Becton, MD Pulmonary and [place] Pager: [phone] After 3 pm or if no response, call [phone]  02/05/2016, 6:27 PM

## 2016-02-05 NOTE — ED Notes (Signed)
Patient is having increased pain and shortness of breath.  Lungs clear.  Applied O2 at 2Lpm to assist in breathing. Will continue to monitor.

## 2016-02-06 ENCOUNTER — Inpatient Hospital Stay (HOSPITAL_COMMUNITY): Payer: Managed Care, Other (non HMO)

## 2016-02-06 DIAGNOSIS — N289 Disorder of kidney and ureter, unspecified: Secondary | ICD-10-CM

## 2016-02-06 DIAGNOSIS — R0602 Shortness of breath: Secondary | ICD-10-CM

## 2016-02-06 DIAGNOSIS — M79609 Pain in unspecified limb: Secondary | ICD-10-CM

## 2016-02-06 DIAGNOSIS — N170 Acute kidney failure with tubular necrosis: Secondary | ICD-10-CM

## 2016-02-06 LAB — URINALYSIS, ROUTINE W REFLEX MICROSCOPIC
Bilirubin Urine: NEGATIVE
Glucose, UA: NEGATIVE mg/dL
Ketones, ur: NEGATIVE mg/dL
LEUKOCYTES UA: NEGATIVE
Nitrite: NEGATIVE
PH: 5 (ref 5.0–8.0)
Protein, ur: NEGATIVE mg/dL
SPECIFIC GRAVITY, URINE: 1.019 (ref 1.005–1.030)

## 2016-02-06 LAB — BASIC METABOLIC PANEL
ANION GAP: 8 (ref 5–15)
Anion gap: 7 (ref 5–15)
Anion gap: 9 (ref 5–15)
BUN: 75 mg/dL — ABNORMAL HIGH (ref 6–20)
BUN: 72 mg/dL — AB (ref 6–20)
BUN: 71 mg/dL — ABNORMAL HIGH (ref 6–20)
CALCIUM: 6.9 mg/dL — AB (ref 8.9–10.3)
CALCIUM: 7.2 mg/dL — AB (ref 8.9–10.3)
CHLORIDE: 102 mmol/L (ref 101–111)
CHLORIDE: 107 mmol/L (ref 101–111)
CO2: 23 mmol/L (ref 22–32)
CO2: 21 mmol/L — ABNORMAL LOW (ref 22–32)
CO2: 22 mmol/L (ref 22–32)
CREATININE: 6.71 mg/dL — AB (ref 0.61–1.24)
CREATININE: 4.35 mg/dL — AB (ref 0.61–1.24)
Calcium: 7.2 mg/dL — ABNORMAL LOW (ref 8.9–10.3)
Chloride: 105 mmol/L (ref 101–111)
Creatinine, Ser: 4.88 mg/dL — ABNORMAL HIGH (ref 0.61–1.24)
GFR calc Af Amer: 10 mL/min — ABNORMAL LOW (ref >60)
GFR calc Af Amer: 15 mL/min — ABNORMAL LOW (ref >60)
GFR calc non Af Amer: 9 mL/min — ABNORMAL LOW (ref >60)
GFR calc non Af Amer: 13 mL/min — ABNORMAL LOW (ref >60)
GFR calc non Af Amer: 14 mL/min — ABNORMAL LOW (ref >60)
GFR, EST AFRICAN AMERICAN: 17 mL/min — AB (ref >60)
GLUCOSE: 112 mg/dL — AB (ref 65–99)
GLUCOSE: 149 mg/dL — AB (ref 65–99)
Glucose, Bld: 129 mg/dL — ABNORMAL HIGH (ref 65–99)
POTASSIUM: 5.9 mmol/L — AB (ref 3.5–5.1)
Potassium: 6.2 mmol/L — ABNORMAL HIGH (ref 3.5–5.1)
Potassium: 5.1 mmol/L (ref 3.5–5.1)
SODIUM: 136 mmol/L (ref 135–145)
Sodium: 133 mmol/L — ABNORMAL LOW (ref 135–145)
Sodium: 135 mmol/L (ref 135–145)

## 2016-02-06 LAB — POCT I-STAT 3, ART BLOOD GAS (G3+)
ACID-BASE DEFICIT: 7 mmol/L — AB (ref 0.0–2.0)
BICARBONATE: 20.3 meq/L (ref 20.0–24.0)
O2 Saturation: 97 %
TCO2: 22 mmol/L (ref 0–100)
pCO2 arterial: 50.5 mmHg — ABNORMAL HIGH (ref 35.0–45.0)
pH, Arterial: 7.215 — ABNORMAL LOW (ref 7.350–7.450)
pO2, Arterial: 117 mmHg — ABNORMAL HIGH (ref 80.0–100.0)

## 2016-02-06 LAB — GLUCOSE, CAPILLARY
GLUCOSE-CAPILLARY: 115 mg/dL — AB (ref 65–99)
Glucose-Capillary: 103 mg/dL — ABNORMAL HIGH (ref 65–99)
Glucose-Capillary: 105 mg/dL — ABNORMAL HIGH (ref 65–99)
Glucose-Capillary: 114 mg/dL — ABNORMAL HIGH (ref 65–99)
Glucose-Capillary: 116 mg/dL — ABNORMAL HIGH (ref 65–99)
Glucose-Capillary: 97 mg/dL (ref 65–99)

## 2016-02-06 LAB — OSMOLALITY, URINE: Osmolality, Ur: 353 mOsm/kg (ref 300–900)

## 2016-02-06 LAB — TROPONIN I
TROPONIN I: 0.28 ng/mL — AB (ref <0.03)
Troponin I: 0.8 ng/mL (ref <0.03)
Troponin I: 1.02 ng/mL (ref <0.03)

## 2016-02-06 LAB — LACTIC ACID, PLASMA
LACTIC ACID, VENOUS: 2.9 mmol/L — AB (ref 0.5–1.9)
Lactic Acid, Venous: 1.2 mmol/L (ref 0.5–1.9)

## 2016-02-06 LAB — CBC
HEMATOCRIT: 30.7 % — AB (ref 39.0–52.0)
HEMOGLOBIN: 10.1 g/dL — AB (ref 13.0–17.0)
MCH: 31 pg (ref 26.0–34.0)
MCHC: 32.9 g/dL (ref 30.0–36.0)
MCV: 94.2 fL (ref 78.0–100.0)
Platelets: 178 10*3/uL (ref 150–400)
RBC: 3.26 MIL/uL — ABNORMAL LOW (ref 4.22–5.81)
RDW: 13.3 % (ref 11.5–15.5)
WBC: 10.8 10*3/uL — ABNORMAL HIGH (ref 4.0–10.5)

## 2016-02-06 LAB — URINE MICROSCOPIC-ADD ON

## 2016-02-06 LAB — CREATININE, URINE, RANDOM: CREATININE, URINE: 236.57 mg/dL

## 2016-02-06 LAB — CORTISOL: CORTISOL PLASMA: 13.6 ug/dL

## 2016-02-06 LAB — PHOSPHORUS: PHOSPHORUS: 8.2 mg/dL — AB (ref 2.5–4.6)

## 2016-02-06 LAB — SODIUM, URINE, RANDOM: SODIUM UR: 44 mmol/L

## 2016-02-06 LAB — PROCALCITONIN: PROCALCITONIN: 0.24 ng/mL

## 2016-02-06 LAB — MAGNESIUM: Magnesium: 1.9 mg/dL (ref 1.7–2.4)

## 2016-02-06 LAB — T4, FREE: Free T4: 0.87 ng/dL (ref 0.61–1.12)

## 2016-02-06 MED ORDER — SODIUM POLYSTYRENE SULFONATE 15 GM/60ML PO SUSP
60.0000 g | Freq: Once | ORAL | Status: AC
  Administered 2016-02-06: 60 g via ORAL
  Filled 2016-02-06: qty 240

## 2016-02-06 MED ORDER — SODIUM POLYSTYRENE SULFONATE 15 GM/60ML PO SUSP
45.0000 g | Freq: Once | ORAL | Status: AC
Start: 2016-02-06 — End: 2016-02-06
  Administered 2016-02-06: 45 g via RECTAL
  Filled 2016-02-06 (×2): qty 180

## 2016-02-06 MED ORDER — ACETAMINOPHEN 325 MG PO TABS
650.0000 mg | ORAL_TABLET | Freq: Four times a day (QID) | ORAL | Status: DC | PRN
  Administered 2016-02-06 – 2016-02-08 (×5): 650 mg via ORAL
  Filled 2016-02-06 (×5): qty 2

## 2016-02-06 MED ORDER — ONDANSETRON HCL 4 MG/2ML IJ SOLN
4.0000 mg | Freq: Once | INTRAMUSCULAR | Status: AC
  Administered 2016-02-06: 4 mg via INTRAVENOUS

## 2016-02-06 MED ORDER — STERILE WATER FOR INJECTION IV SOLN
INTRAVENOUS | Status: DC
  Administered 2016-02-06 – 2016-02-07 (×2): via INTRAVENOUS
  Filled 2016-02-06 (×6): qty 850

## 2016-02-06 MED ORDER — PNEUMOCOCCAL VAC POLYVALENT 25 MCG/0.5ML IJ INJ
0.5000 mL | INJECTION | INTRAMUSCULAR | Status: AC
  Administered 2016-02-07: 0.5 mL via INTRAMUSCULAR
  Filled 2016-02-06: qty 0.5

## 2016-02-06 NOTE — Progress Notes (Signed)
Verbal order to attempt to give patient kayexelate orally instead of rectally. Will continue to monitor. If pt cannot tolerate oral suspension, will attempt to administer rectally

## 2016-02-06 NOTE — Progress Notes (Signed)
VASCULAR LAB PRELIMINARY  PRELIMINARY  PRELIMINARY  PRELIMINARY  Bilateral lower extremity venous duplex completed.    Preliminary report:  There is no DVT or SVT noted in the bilateral lower extremities.   Jamiaya Bina, RVT 02/06/2016, 3:28 PM

## 2016-02-06 NOTE — Progress Notes (Signed)
CRITICAL VALUE ALERT  Critical value received:  Troponin 0.28  Date of notification:  02/06/16   Time of notification:  0400  Critical value read back:Yes.    Nurse who received alert:  Lauretta Grill  MD notified (1st page):  Deterding    Time of first page:  0410  MD notified (2nd page):  Time of second page:  Responding MD:  Deterding  Time MD responded:  [phone]

## 2016-02-06 NOTE — Progress Notes (Signed)
Subjective: Interval History: BP soft overnight. He is mentating well, eating breakfast. Good UOP. Hyperkalemia initally improved but K back up to 6.8 this morning. Given another dose of kayexalate but vomited most of it up. Getting another dose per rectum this morning. No chest pain or palpitations.   Objective: Vital signs in last 24 hours: Temp:  [98.5 F (36.9 C)-98.6 F (37 C)] 98.6 F (37 C) (07/29 0100) Pulse Rate:  [40-124] 109 (07/29 0900) Resp:  [10-27] 14 (07/29 0900) BP: (64-142)/(32-114) 95/54 (07/29 0900) SpO2:  [70 %-100 %] 100 % (07/29 0900) Weight:  [268 lb 4.8 oz (121.7 kg)] 268 lb 4.8 oz (121.7 kg) (07/29 0500) Weight change:   Intake/Output from previous day: 07/28 0701 - 07/29 0700 In: 480 [P.O.:480] Out: 955 [Urine:955] Intake/Output this shift: Total I/O In: 26 [I.V.:26] Out: -   PHYSICAL EXAM GENERAL- alert, sitting up in bed eating breakfast, no acute distress HEENT- Atraumatic, normocephalic, PERRL, EOMI, oral mucosa appears moist CARDIAC- tachycardic, normal rhythm, no murmurs, rubs or gallops. RESP- Moving equal volumes of air, and clear to auscultation bilaterally, no wheezes or crackles. ABDOMEN- Soft, nontender, bowel sounds present. NEURO- AAOx3, No obvious Cr N abnormality. EXTREMITIES- pulse 2+, symmetric, no pedal edema. SKIN- Warm, dry, No rash or lesion. PSYCH- Normal mood and affect, appropriate thought content and speech.   I have reviewed the patient's current medications.  Assessment/Plan: 1 AKI - Renal function slowly improving. Cr 9.2 > 6.7 this morning. Urine output overnight with ~1L out. Blood pressures have been soft despite aggressive fluids. No change in mental status. Persistent hyperkalemia. Phos improving 11 > 8.7. Urine studies never sent. Spoke with nurse and will send this morning. Follow up results. Renal ultrasound with no evidence of obstruction. Most likely drug induced (NSAID, ACE-I) in setting of volume depletion but  not classic history. Does complain of back pain, ? Papillary necrosis. Urine studies will help determine etiology. Continue IVFs.  -U/A -Urine creatinine, Urine sodium, Urine osms -Hold anti-hypertensives -Avoid nephrotoxic agetns 2. Hyperkalemia - K 6.8 on arrival. Initially improved to 5.2 but increased to 6.2 overnight. Received another dose of kayexalate this morning but vomited most of it back up. Getting kayexalate per rectum now.  -Repeat labs after Kayexalate 3 Hypotension/Sinus tachy 2/2 volume depeletion: Lactic acidosis resolved with IVF. Remains tachycardic with soft BP. SBP 90-100s. Hypertensive at baseline. Mentation intact. Continue IVF 4. Acidosis: pH 7.2/50.5/117/20. Respiratory acidosis without compensation. Not be to retain bicarb. Starting bicarb IV. BIPAP if not improving.  5. Elevated troponin: Troponin 0.26. Possibly demand. Trending. ECHO. 6. DM  7. Hypothyroid 8. obesity  Scott Simmons 02/05/2016, 4:23 PM    LOS: 1 day   Scott Simmons 02/06/2016,9:23 AM  I have seen and examined this patient and agree with the plan of care seen, examined eval, couseled patient.  Discussed with resident and CCM. Raliegh Ip yet and low bp for someone who has HTN.  Check 2D and Trop .  Jayvian Escoe L 02/06/2016, 9:53 AM

## 2016-02-06 NOTE — H&P (Signed)
PULMONARY / CRITICAL CARE MEDICINE   Name: Scott Simmons MRN: [id number] DOB: Jul 17, 1965    ADMISSION DATE:  02/05/2016 CONSULTATION DATE:  02/05/16  REFERRING MD:  Dr. Jimmy Footman and EDP  CHIEF COMPLAINT:  weakness  HISTORY OF PRESENT ILLNESS:   Scott Simmons is an 50 y.o. Male with a past medical history of non-insulin dependant diabetes type 2, hypertension presenting from PCP office after labs showed AKI with Cr of 9.2 and hyperkalemia. Patient reports that he has been constipated for several weeks and make an appointment to see his PCP. He has been having episodes of blurry vision for the past several days as well. He has not been feeling well for the last 1-2 weeks, has not been eating much as well, continues to take his lisinopril, and has also been taking NSAIDS 2/2 gend pain/weakness.   He has been working until 7/27. He felt dizzy today so he ended up in the ED.  Initially BP was 140/110 (likely erroneous) then eventually declined to 0000000 systolic with a HR of 123456. He was seen by renal service for AKI.  He is mentating well. Foley placed and no u.o. K was 6.8 and he has gotten meds.  BP remained 80 systolic with HR in [id number] so PCCM was asked to admit to ICU.  By the time I saw pt, he was comfortable, mentating well, some vague chest discomfort, denies SOB. I have ordered 3rd L NS to be hung.   Subjective: K to 6.2 Minimal kayxlate given, crt down MAP borderline Pos balance Urine output improved  VITAL SIGNS: BP (!) 117/52   Pulse (!) 114   Temp 98.6 F (37 C) (Oral)   Resp 14   Wt 121.7 kg (268 lb 4.8 oz)   SpO2 100%   BMI 38.50 kg/m   HEMODYNAMICS:    VENTILATOR SETTINGS:    INTAKE / OUTPUT: I/O last 3 completed shifts: In: 480 [P.O.:480] Out: 955 [Urine:955]  PHYSICAL EXAMINATION: General:  Awake, not in distress. Comfortable, snoring Neuro:  CN grossly intact. nonfocal HEENT:  PERLA Cardiovascular:  s1 s2 rrTachycardic Lungs:  CTA  anterior Abdomen:  Soft, NT, (-) masses/tenderness.  Musculoskeletal:  (-) edema/clubbing/cyanosis Skin:  Warm and dry. (-) rashes.   LABS:  BMET  Recent Labs Lab 02/05/16 1400 02/05/16 2051 02/06/16 0257  NA 134* 133* 133*  K 6.8* 5.2* 6.2*  CL 96* 99* 102  CO2 21* 17* 23  BUN 78* 77* 75*  CREATININE 9.28* 8.92* 6.71*  GLUCOSE 98 155* 112*    Electrolytes  Recent Labs Lab 02/05/16 1400 02/05/16 2051 02/06/16 0257  CALCIUM 8.2* 7.4* 6.9*  MG  --   --  1.9  PHOS 11.0*  --  8.2*    CBC  Recent Labs Lab 02/05/16 1400 02/06/16 0257  WBC 11.0* 10.8*  HGB 12.6* 10.1*  HCT 38.3* 30.7*  PLT 274 178    Coag's No results for input(s): APTT, INR in the last 168 hours.  Sepsis Markers  Recent Labs Lab 02/05/16 1844 02/05/16 2126 02/05/16 2347 02/06/16 0257  LATICACIDVEN 4.15*  --  2.9* 1.2  PROCALCITON  --  0.30  --  0.24    ABG No results for input(s): PHART, PCO2ART, PO2ART in the last 168 hours.  Liver Enzymes  Recent Labs Lab 02/05/16 1400  AST 19  ALT 17  ALKPHOS 54  BILITOT 0.9  ALBUMIN 4.3    Cardiac Enzymes  Recent Labs Lab 02/06/16 0257  TROPONINI 0.28*  Glucose  Recent Labs Lab 02/05/16 2017 02/05/16 2348 02/06/16 0502  GLUCAP 151* 137* 114*    Imaging US Renal  Result Date: 02/05/2016 CLINICAL DATA:  Acute renal injury, history of hypertension EXAM: RENAL / URINARY TRACT ULTRASOUND COMPLETE COMPARISON:  09/18/2006 FINDINGS: Right Kidney: Length: 13.5 cm. Echogenicity within normal limits. No mass or hydronephrosis visualized. Left Kidney: Length: 13.3 cm. Echogenicity within normal limits. No mass or hydronephrosis visualized. Bladder: Decompressed IMPRESSION: No acute abnormality noted. Electronically Signed   By: Inez Catalina M.D.   On: 02/05/2016 21:48  Dg Chest Port 1 View  Result Date: 02/06/2016 CLINICAL DATA:  Shortness of breath EXAM: PORTABLE CHEST 1 VIEW COMPARISON:  February 05, 2016 FINDINGS: Low lung  volumes limit evaluation. Increased interstitial markings are identified and mild. No other interval changes or acute abnormalities. IMPRESSION: Mild increased interstitial markings could represent mild edema versus vascular crowding from low volumes. Recommend clinical correlation and attention on follow-up. Electronically Signed   By: Dorise Bullion III M.D   On: 02/06/2016 07:24  Dg Chest Port 1 View  Result Date: 02/05/2016 CLINICAL DATA:  Chest pain, nausea, blurred vision. EXAM: PORTABLE CHEST 1 VIEW COMPARISON:  06/27/2014 FINDINGS: Cardiomediastinal silhouette is normal. Mediastinal contours appear intact. There is no evidence of focal airspace consolidation, pleural effusion or pneumothorax. Osseous structures are without acute abnormality. Soft tissues are grossly normal. IMPRESSION: No active disease. Electronically Signed   By: Fidela Salisbury M.D.   On: 02/05/2016 17:32   STUDIES:  7/28 renal US>>>negative  CULTURES: MRSA 7/28 >>>  ANTIBIOTICS: None  SIGNIFICANT EVENTS: 7/28 admit for AKI, hyperk, pos fluid baalnce  LINES/TUBES:   DISCUSSION: 50 year old male, known to have diabetes, thyroid disease, comes in with 1-2 week history of generalized weakness, lethargy, poor by mouth intake. He continued to take his lisinopril and was also taking NSAIDs for his pain. Had blurring of vision. Became dizzy on standing. At the emergency room, he was found to have acute kidney injury with hyperkalemia. He was also found to be hypotensive in the [id number] systolic, tachycardic in the 120s. Currently getting fluid boluses.  ASSESSMENT / PLAN:  PULMONARY A: OSA P:   Not tolerant to CPAP Would prefer bipap Get abg in setting hyperK  Repeat pcxr I n am for edema? After volume given  CARDIOVASCULAR A:  Sinus tachycardia 2/2 Volume depletion Clearing lactic P:  Continue IVF troponins further Bolus given, allow pos balance Get echo Get cortisol  RENAL A:   AKI 2/2 Volume  depletion/Poor PO intake/ NSAID and ACE inhibitor use Hyperkalemia P:   Chem now and q4h Continued pos balance Make kayxlate rectal now as vomiting other up  NO ACEI in future  GASTROINTESTINAL A:   n/v P:   renal diet   HEMATOLOGIC A:   DV Tpreventrion P:  Sub q hep  INFECTIOUS A:   No signs of infection now, Lactate N P:   Lactic cleared Pct unimpressive  ENDOCRINE A:   Has graves dse Has DM R/o rel AI P:   Insulin SS for now Thyroxine Cortisol Get t3. T4,. Low tsh noted  NEUROLOGIC A:   (-) issues P:   RASS goal: 0 Will observe   FAMILY  - Updates: Pt and wife updated at bedside.   - Inter-disciplinary family meet or Palliative Care meeting due by:  Aug 4  Ccm time 35 min   Lavon Paganini. Titus Mould, MD, [place] Pgr: [facility]

## 2016-02-06 NOTE — Progress Notes (Signed)
[facility] Progress Note Patient Name: Scott Simmons DOB: 05/04/1966 MRN: [id number]   Date of Service  02/06/2016  HPI/Events of Note  Hyperkalemia with no EKG changes.  Received a dose of Kayexalate earlier in PM but no response.  eICU Interventions  Plan: 60 gm Kayexalate po now. F/U BMET at 12N     Intervention Category Major Interventions: Electrolyte abnormality - evaluation and management  Aricka Goldberger 02/06/2016, 4:37 AM

## 2016-02-07 ENCOUNTER — Inpatient Hospital Stay (HOSPITAL_COMMUNITY): Payer: Managed Care, Other (non HMO)

## 2016-02-07 LAB — CBC WITH DIFFERENTIAL/PLATELET
BASOS ABS: 0 10*3/uL (ref 0.0–0.1)
BASOS PCT: 0 %
EOS ABS: 0.1 10*3/uL (ref 0.0–0.7)
EOS PCT: 1 %
HCT: 34.2 % — ABNORMAL LOW (ref 39.0–52.0)
Hemoglobin: 11 g/dL — ABNORMAL LOW (ref 13.0–17.0)
Lymphocytes Relative: 19 %
Lymphs Abs: 1 10*3/uL (ref 0.7–4.0)
MCH: 30.4 pg (ref 26.0–34.0)
MCHC: 32.2 g/dL (ref 30.0–36.0)
MCV: 94.5 fL (ref 78.0–100.0)
MONO ABS: 0.6 10*3/uL (ref 0.1–1.0)
MONOS PCT: 12 %
Neutro Abs: 3.6 10*3/uL (ref 1.7–7.7)
Neutrophils Relative %: 68 %
PLATELETS: 175 10*3/uL (ref 150–400)
RBC: 3.62 MIL/uL — ABNORMAL LOW (ref 4.22–5.81)
RDW: 12.7 % (ref 11.5–15.5)
WBC: 5.4 10*3/uL (ref 4.0–10.5)

## 2016-02-07 LAB — GLUCOSE, CAPILLARY
GLUCOSE-CAPILLARY: 135 mg/dL — AB (ref 65–99)
GLUCOSE-CAPILLARY: 97 mg/dL (ref 65–99)
GLUCOSE-CAPILLARY: 142 mg/dL — AB (ref 65–99)
Glucose-Capillary: 106 mg/dL — ABNORMAL HIGH (ref 65–99)
Glucose-Capillary: 138 mg/dL — ABNORMAL HIGH (ref 65–99)

## 2016-02-07 LAB — BLOOD GAS, ARTERIAL
Acid-Base Excess: 0.9 mmol/L (ref 0.0–2.0)
Bicarbonate: 24.9 mEq/L — ABNORMAL HIGH (ref 20.0–24.0)
DRAWN BY: 418751
FIO2: 0.21
O2 Saturation: 97.1 %
PATIENT TEMPERATURE: 98.6
PCO2 ART: 38.7 mmHg (ref 35.0–45.0)
PH ART: 7.423 (ref 7.350–7.450)
TCO2: 26.1 mmol/L (ref 0–100)
pO2, Arterial: 87.5 mmHg (ref 80.0–100.0)

## 2016-02-07 LAB — BASIC METABOLIC PANEL
ANION GAP: 7 (ref 5–15)
BUN: 45 mg/dL — ABNORMAL HIGH (ref 6–20)
CALCIUM: 8.2 mg/dL — AB (ref 8.9–10.3)
CHLORIDE: 107 mmol/L (ref 101–111)
CO2: 26 mmol/L (ref 22–32)
CREATININE: 1.55 mg/dL — AB (ref 0.61–1.24)
GFR calc non Af Amer: 50 mL/min — ABNORMAL LOW (ref >60)
GFR, EST AFRICAN AMERICAN: 58 mL/min — AB (ref >60)
GLUCOSE: 132 mg/dL — AB (ref 65–99)
Potassium: 5.3 mmol/L — ABNORMAL HIGH (ref 3.5–5.1)
Sodium: 140 mmol/L (ref 135–145)

## 2016-02-07 LAB — CK: CK TOTAL: 297 U/L (ref 49–397)

## 2016-02-07 LAB — PROCALCITONIN: Procalcitonin: <0.1 ng/mL

## 2016-02-07 LAB — PHOSPHORUS: PHOSPHORUS: 2.3 mg/dL — AB (ref 2.5–4.6)

## 2016-02-07 LAB — TROPONIN I: TROPONIN I: 0.63 ng/mL — AB (ref <0.03)

## 2016-02-07 LAB — TSH: TSH: 0.487 u[IU]/mL (ref 0.350–4.500)

## 2016-02-07 MED ORDER — SORBITOL 70 % SOLN
30.0000 mL | Freq: Every day | Status: DC | PRN
  Administered 2016-02-07: 30 mL via ORAL

## 2016-02-07 MED ORDER — BISACODYL 10 MG RE SUPP
10.0000 mg | Freq: Every day | RECTAL | Status: DC | PRN
  Administered 2016-02-07: 10 mg via RECTAL
  Filled 2016-02-07: qty 1

## 2016-02-07 MED ORDER — SODIUM PHOSPHATES 45 MMOLE/15ML IV SOLN
20.0000 mmol | Freq: Once | INTRAVENOUS | Status: AC
  Administered 2016-02-07: 20 mmol via INTRAVENOUS
  Filled 2016-02-07: qty 6.67

## 2016-02-07 MED ORDER — ONDANSETRON HCL 4 MG/2ML IJ SOLN
4.0000 mg | Freq: Three times a day (TID) | INTRAMUSCULAR | Status: DC | PRN
  Administered 2016-02-07 – 2016-02-08 (×2): 4 mg via INTRAVENOUS
  Filled 2016-02-07 (×2): qty 2

## 2016-02-07 MED ORDER — BISACODYL 5 MG PO TBEC
5.0000 mg | DELAYED_RELEASE_TABLET | Freq: Every day | ORAL | Status: DC | PRN
  Administered 2016-02-07: 5 mg via ORAL
  Filled 2016-02-07: qty 1

## 2016-02-07 NOTE — Progress Notes (Signed)
E-link contacted per Dr. Hilbert Bible for pt orders.

## 2016-02-07 NOTE — Progress Notes (Addendum)
Pt complaining of headache, lower bach ache radiating to tailbone, constipation, nausea, cramping in calves, and is febrile T 102.1, VS WNL.  Tylenol administered per prn order.  Dr. Hilbert Bible paged at 2355, and 0015.  Will continue to monitor.

## 2016-02-07 NOTE — Progress Notes (Signed)
Subjective: Interval History: Patient febrile to 102.6 overnight. Complaints of nausea and headache overnight. Still having headache this morning, mild nausea. Fever resolved with Tylenol. Troponin up to 1.03 yesterday. Denies any chest pain, palpitations or shortness of breath. No dysuria. Hyperkalemia improved with kayexalate.   Objective: Vital signs in last 24 hours: Temp:  [97.8 F (36.6 C)-102.6 F (39.2 C)] 97.8 F (36.6 C) (07/30 0810) Pulse Rate:  [101-112] 101 (07/30 0400) Resp:  [11-18] 11 (07/30 0400) BP: (92-133)/(44-75) 133/75 (07/30 0336) SpO2:  [92 %-100 %] 92 % (07/30 0400) Weight:  [271 lb 4.8 oz (123.1 kg)] 271 lb 4.8 oz (123.1 kg) (07/30 0336) Weight change: 3 lb (1.361 kg)  Intake/Output from previous day: 07/29 0701 - 07/30 0700 In: 2263.5 [I.V.:2263.5] Out: 3510 [Urine:3510] Intake/Output this shift: Total I/O In: -  Out: 1000 [Urine:1000]  PHYSICAL EXAM GENERAL- alert, sitting up in bed eating breakfast, no acute distress HEENT- Atraumatic, normocephalic, PERRL, EOMI, oral mucosa appears moist CARDIAC- tachycardic, normal rhythm, no murmurs, rubs or gallops. RESP- Moving equal volumes of air, and clear to auscultation bilaterally, no wheezes or crackles. ABDOMEN- Soft, nontender, bowel sounds present. NEURO- AAOx3, No obvious Cr N abnormality. EXTREMITIES- pulse 2+, symmetric, no pedal edema. SKIN- Warm, dry, No rash or lesion. PSYCH- Normal mood and affect, appropriate thought content and speech.   I have reviewed the patient's current medications.  Assessment/Plan: 1 AKI - Renal function improving. Cr 4.35 > 1.55 this morning (baseline ~0.6). Urine output overnight with ~1.2L net out. Blood pressures have been stable. U/A with Hgb and no RBCs. No proteinuria or signs of infection. Initial CK was normal but U/A suggestive of some rhabdo. No evidence of papillary necrosis. FENa 0.7, suggestive of pre-renal azotemia. Most likely drug induced (NSAID,  ACE-I) in setting of volume depletion but not classic history.  Etiology not as clear as would like.  Await culures and 2 D Continue IVF.  2. New Fever: Patient febrile to 102.6 overnight. Resolved with Tylenol, no ABX started. No CBC from today. Yesterday WBC 10.8. No obvious signs of infection. CXR on admission clear, UA clear. Remains tachycardic despite fluid repletion. -Blood cultures -CBC with diff ?related to cause of AKI 3. Hyperkalemia - K improved with Kayexalate. K 5.3 this morning. Continue to monitor.  4 Hypotension/Sinus tachy 2/2 volume depeletion: Lactic acidosis resolved with IVF. Remains tachycardic despite fluid repletion.  5. Acidosis: Resolved D/C bicarb.  6. Elevated troponin: Troponin 0.26>1.03. Continue to trend. ECHO pending. EKG with no STEMI. Consider cards consult if continues to trend up. 7. DM  8. Hypothyroid 9. obesity  Maryellen Pile 02/05/2016, 4:23 PM    LOS: 2 days   Maryellen Pile 02/07/2016,10:03 AM  I have seen and examined this patient and agree with the plan of care seen, eval, counseled patient and family. Discussed with resident and primary. .  Loralee Weitzman L 02/07/2016, 4:04 PM

## 2016-02-07 NOTE — Progress Notes (Addendum)
PULMONARY / CRITICAL CARE MEDICINE   Name: Scott Simmons MRN: [id number] DOB: 11-Nov-1965    ADMISSION DATE:  02/05/2016 CONSULTATION DATE:  02/05/16  REFERRING MD:  Dr. Jimmy Footman and EDP  CHIEF COMPLAINT:  weakness  HISTORY OF PRESENT ILLNESS:   Scott Simmons is an 50 y.o. Male with a past medical history of non-insulin dependant diabetes type 2, hypertension presenting from PCP office after labs showed AKI with Cr of 9.2 and hyperkalemia. Patient reports that he has been constipated for several weeks and make an appointment to see his PCP. He has been having episodes of blurry vision for the past several days as well. He has not been feeling well for the last 1-2 weeks, has not been eating much as well, continues to take his lisinopril, and has also been taking NSAIDS 2/2 gend pain/weakness.   He has been working until 7/27. He felt dizzy today so he ended up in the ED.  Initially BP was 140/110 (likely erroneous) then eventually declined to 0000000 systolic with a HR of 123456. He was seen by renal service for AKI.  He is mentating well. Foley placed and no u.o. K was 6.8 and he has gotten meds.  BP remained 80 systolic with HR in [id number] so PCCM was asked to admit to ICU.   Subjective:   VITAL SIGNS: BP 109/68 (BP Location: Left Arm)   Pulse 96   Temp 98.6 F (37 C) (Oral)   Resp 19   Ht 5\' 10"  (1.778 m)   Wt 271 lb 4.8 oz (123.1 kg)   SpO2 98%   BMI 38.93 kg/m   HEMODYNAMICS:    VENTILATOR SETTINGS:    INTAKE / OUTPUT: I/O last 3 completed shifts: In: 2743.5 [P.O.:480; I.V.:2263.5] Out: [id number] [Urine:4465]  PHYSICAL EXAMINATION: General:  Awake, not in distress. Comfortable, snoring Neuro:  CN grossly intact. nonfocal HEENT:  PERLA Cardiovascular:  s1 s2 rrTachycardic Lungs:  CTA anterior Abdomen:  Soft, NT, (-) masses/tenderness.  Musculoskeletal:  (-) edema/clubbing/cyanosis Skin:  Warm and dry. (-) rashes.   LABS:  BMET  Recent Labs Lab 02/06/16 1055  02/06/16 1502 02/07/16 0430  NA 135 136 140  K 5.9* 5.1 5.3*  CL 107 105 107  CO2 21* 22 26  BUN 72* 71* 45*  CREATININE 4.88* 4.35* 1.55*  GLUCOSE 149* 129* 132*    Electrolytes  Recent Labs Lab 02/05/16 1400  02/06/16 0257 02/06/16 1055 02/06/16 1502 02/07/16 0430 02/07/16 0950  CALCIUM 8.2*  < > 6.9* 7.2* 7.2* 8.2*  --   MG  --   --  1.9  --   --   --   --   PHOS 11.0*  --  8.2*  --   --   --  2.3*  < > = values in this interval not displayed.  CBC  Recent Labs Lab 02/05/16 1400 02/06/16 0257  WBC 11.0* 10.8*  HGB 12.6* 10.1*  HCT 38.3* 30.7*  PLT 274 178    Coag's No results for input(s): APTT, INR in the last 168 hours.  Sepsis Markers  Recent Labs Lab 02/05/16 1844 02/05/16 2126 02/05/16 2347 02/06/16 0257 02/07/16 0430  LATICACIDVEN 4.15*  --  2.9* 1.2  --   PROCALCITON  --  0.30  --  0.24 <0.10    ABG  Recent Labs Lab 02/06/16 0814 02/07/16 0236  PHART 7.215* 7.423  PCO2ART 50.5* 38.7  PO2ART 117.0* 87.5    Liver Enzymes  Recent Labs Lab 02/05/16 1400  AST 19  ALT 17  ALKPHOS 54  BILITOT 0.9  ALBUMIN 4.3    Cardiac Enzymes  Recent Labs Lab 02/06/16 0933 02/06/16 1502 02/07/16 0950  TROPONINI 0.80* 1.02* 0.63*    Glucose  Recent Labs Lab 02/06/16 1246 02/06/16 1611 02/06/16 1953 02/06/16 2345 02/07/16 0341 02/07/16 0731  GLUCAP 97 115* 103* 105* 135* 106*    Imaging Dg Chest Port 1 View  Result Date: 02/07/2016 CLINICAL DATA:  Acute renal failure and fatigue. EXAM: PORTABLE CHEST 1 VIEW COMPARISON:  02/06/2016 and prior exams FINDINGS: The cardiomediastinal silhouette is unremarkable. There is no evidence of focal airspace disease, pulmonary edema, suspicious pulmonary nodule/mass, pleural effusion, or pneumothorax. No acute bony abnormalities are identified. IMPRESSION: No active disease. Electronically Signed   By: Margarette Canada M.D.   On: 02/07/2016 09:02   STUDIES:  7/28 renal US >>>  negative  CULTURES: MRSA 7/28 >> negative BCx2 7/30 >>   ANTIBIOTICS: None  SIGNIFICANT EVENTS: 7/28 admit for AKI, hyperk, pos fluid balnce  LINES/TUBES:   DISCUSSION: 50 year old male, known to have diabetes, thyroid disease, comes in with 1-2 week history of generalized weakness, lethargy, poor by mouth intake. He continued to take his lisinopril and was also taking NSAIDs for his pain. Had blurring of vision. Became dizzy on standing. At the emergency room, he was found to have acute kidney injury with hyperkalemia. He was also found to be hypotensive in the 123XX123 systolic, tachycardic in the 120s. Currently getting fluid boluses.  ASSESSMENT / PLAN:  PULMONARY A: OSA P:   Not tolerant to CPAP, noncompliant Monitor intermittent CXR  CARDIOVASCULAR A:  Hypotension - normal cortisol  Sinus tachycardia 2/2 Volume depletion Elevated Lactic Acid - clearing  Elevated Troponin - in setting of demand ischemia with hypotension P:  Continue IVF Trend troponin  Allow positive balance Await ECHO   RENAL A:   AKI 2/2 Volume depletion/Poor PO intake/ NSAID and ACE inhibitor use Hyperkalemia Hypophosphatemia P:   CMP in am  Continued pos balance Consider > NO ACEI in future [place] 7/30   GASTROINTESTINAL A:   n/v P:   renal diet   HEMATOLOGIC A:   DVT preventrion P:  Sub q hep  INFECTIOUS A:   No signs of infection now, Lactate clearing P:   Monitor fever curve / WBC  Trend PCT   ENDOCRINE A:   Graves Disease - T4 0.87 DM P:   Insulin SS for now Continue synthroid 150 mcg  Assess TSH  Will need follow up with Dr. Buddy Duty  NEUROLOGIC A:   (-) issues P:   RASS goal: 0 Monitor / supportive care   FAMILY  - Updates: Patient updated at bedside 7/30   - Inter-disciplinary family meet or Palliative Care meeting due by:  Aug 4  Transfer to [facility] as of 7/31 am.  If to d/c 7/31, please call PCCM   Noe Gens, NP-C [place] Pgr: [phone] or if no answer [phone] 02/07/2016, 11:45 AM   STAFF NOTE: I, Merrie Roof, MD FACP have personally reviewed patient's available data, including medical history, events of note, physical examination and test results as part of my evaluation. I have discussed with resident/NP and other care providers such as pharmacist, RN and RRT. In addition, I personally evaluated patient and elicited key findings of: in bed, no distress, CTA, distant, renal fxn resolving, urine output good, need to not restart acei, dc nsaids, supp phos, follow k again in am ,  abg shows resolution ph, cpap can be used at night if he is compliant, will need sleep doc follow up as outpt, would want to follow K one more time pre dc, tsh to assess, mobilize further To triad, unless likely to dc home in am , we would do dc summary Fever noted, resolved, atx?, he wil get home cpap mask fixed asap, dc sub q hp as he is walking, dc celebrex upon dc Lavon Paganini. Titus Mould, MD, [place] Pgr: [facility] 02/07/2016 2:59 PM

## 2016-02-07 NOTE — Progress Notes (Signed)
[facility] Progress Note Patient Name: AMAHD BRAKEMAN DOB: [birth year] MRN: [id number]   Date of Service  02/07/2016  HPI/Events of Note  Call from bedside nurse reporting mulitple c/o of abd/back pain, nausea without vomiting, fever, consitipation although 2 documented stools yesterday following adminstration of kayexalate with sorbitol.  Also on bicarb gtt.  Received tylenol for fever.  eICU Interventions  Plan: 1. Check BMET and ABG to followup on acidosis and assess need for bicarb gtt 2. Sorbitol/biscodyl po and rectal for ongoing consitipation - consider need to manually check for impaction.      Intervention Category Intermediate Interventions: Other:  Feven Alderfer 02/07/2016, 2:26 AM

## 2016-02-07 NOTE — Progress Notes (Signed)
After speaking with Gwyndolyn Saxon, Floor Supervisor, attempted to contact Dr. Hilbert Bible by cellphone at [phone], [phone], and paged again to 3W at [phone].   Pt remains stable, VS WNL.  Please see writer's previous progress note. Will continue to monitor.

## 2016-02-08 ENCOUNTER — Encounter: Payer: Self-pay | Admitting: Pulmonary Disease

## 2016-02-08 ENCOUNTER — Inpatient Hospital Stay (HOSPITAL_COMMUNITY): Payer: Managed Care, Other (non HMO)

## 2016-02-08 DIAGNOSIS — R072 Precordial pain: Secondary | ICD-10-CM

## 2016-02-08 LAB — COMPREHENSIVE METABOLIC PANEL
ALK PHOS: 36 U/L — AB (ref 38–126)
ALT: 15 U/L — AB (ref 17–63)
AST: 18 U/L (ref 15–41)
Albumin: 3.1 g/dL — ABNORMAL LOW (ref 3.5–5.0)
Anion gap: 6 (ref 5–15)
BUN: 21 mg/dL — AB (ref 6–20)
CALCIUM: 8.1 mg/dL — AB (ref 8.9–10.3)
CO2: 27 mmol/L (ref 22–32)
CREATININE: 0.85 mg/dL (ref 0.61–1.24)
Chloride: 108 mmol/L (ref 101–111)
GFR calc non Af Amer: >60 mL/min (ref >60)
GLUCOSE: 106 mg/dL — AB (ref 65–99)
Potassium: 4.5 mmol/L (ref 3.5–5.1)
SODIUM: 141 mmol/L (ref 135–145)
Total Bilirubin: 0.5 mg/dL (ref 0.3–1.2)
Total Protein: 5.3 g/dL — ABNORMAL LOW (ref 6.5–8.1)

## 2016-02-08 LAB — PHOSPHORUS: Phosphorus: 2.2 mg/dL — ABNORMAL LOW (ref 2.5–4.6)

## 2016-02-08 LAB — CBC
HCT: 29.1 % — ABNORMAL LOW (ref 39.0–52.0)
Hemoglobin: 9.9 g/dL — ABNORMAL LOW (ref 13.0–17.0)
MCH: 31.4 pg (ref 26.0–34.0)
MCHC: 34 g/dL (ref 30.0–36.0)
MCV: 92.4 fL (ref 78.0–100.0)
PLATELETS: 153 10*3/uL (ref 150–400)
RBC: 3.15 MIL/uL — AB (ref 4.22–5.81)
RDW: 12.7 % (ref 11.5–15.5)
WBC: 4.3 10*3/uL (ref 4.0–10.5)

## 2016-02-08 LAB — ECHOCARDIOGRAM COMPLETE
Height: 70 in
WEIGHTICAEL: 4321.6 [oz_av]

## 2016-02-08 LAB — GLUCOSE, CAPILLARY
GLUCOSE-CAPILLARY: 95 mg/dL (ref 65–99)
Glucose-Capillary: 102 mg/dL — ABNORMAL HIGH (ref 65–99)
Glucose-Capillary: 107 mg/dL — ABNORMAL HIGH (ref 65–99)
Glucose-Capillary: 110 mg/dL — ABNORMAL HIGH (ref 65–99)

## 2016-02-08 MED ORDER — SODIUM PHOSPHATES 45 MMOLE/15ML IV SOLN
10.0000 mmol | Freq: Once | INTRAVENOUS | Status: AC
  Administered 2016-02-08: 10 mmol via INTRAVENOUS
  Filled 2016-02-08: qty 3.33

## 2016-02-08 NOTE — Discharge Summary (Signed)
Physician Discharge Summary  Patient ID: Scott Simmons MRN: [id number] DOB/AGE: 51-Aug-1967 51 y.o.  Admit date: 02/05/2016 Discharge date: 02/08/2016    Discharge Diagnoses:  AKI secondary to volume depletion, poor PO intake and NSAID / ACE inhibitor use Hyperkalemia Hypophosphatemia Hypotension Sinus Tachycardia Lactic Acidosis Elevated Troponin / Demand Ischemia OSA Graves' Disease Diabetes Mellitus Fever  Anemia  Chronic Pain                                                                         DISCHARGE PLAN BY DIAGNOSIS     AKI secondary to volume depletion, poor PO intake and NSAID / ACE inhibitor use Hyperkalemia Hypophosphatemia  Discharge Plan: Hold home lisinopril, metformin, NSAIDS  Follow up BMP, Phos at time of follow up to review renal function Hold anti-hypertensive / diabetes agents until review of repeat BMP to ensure resolution    Hypotension - resolved.  Sinus Tachycardia - resolved.  Lactic Acidosis -  Resolved. Elevated Troponin / Demand Ischemia - resolving  Discharge Plan: Follow up with Dr. Gwenlyn Found  Follow up ECHO with PCP / Dr. Gwenlyn Found post discharge   OSA - on CPAP, machine is not working properly, will ask [facility] to review home machine   Discharge Plan: Continue CPAP as ordered  [facility] to review home machine   Graves' Disease Diabetes Mellitus  Discharge Plan: Continue synthroid  Hold home diabetes regimen until repeat BMP review of serum creatinine   Fever - isolated fever x1 in hospital to 102.6, resolved.  No acute evidence of infectious process.  Discharge Plan: Follow up CBC  Pt instructed to call if new or worsening symptoms   Anemia - mild anemia, hx NSAID use  Discharge Plan: Repeat CBC with PCP  Avoid NSAIDS   Chronic Pain   Discharge Plan: Continue home suboxone as previously ordered  No new Rx at time of discharge                 DISCHARGE SUMMARY   Scott Simmons is a 50 y.o. y/o male with a PMH of  non-insulin-dependent diabetes (on metformin), Graves' disease, and hypertension (on an ACE-I prior to admit) who presented to [facility] on 02/05/2016 with reports of weakness.  Patient was seen by his PCP on 7/28 where lab work showed acute kidney injury with a creatinine of 9.2 and hyperkalemia. He was referred to the emergency room for further evaluation. The patient reported on admission that he been experiencing episodes of blurry vision for several days, decreased oral intake, NSAID use in the setting of generalized pain & weakness for 1-2 weeks.  The patient works as a Engineer, building services and continued to go to work up until 7/27.  He reported he began feeling dizzy at work prompting evaluation by PCP.  Initial ER work up notable for hypotension with a systolic pressure of 67-70 and tachycardia.  Initial labs Na 134, K 6.8, Cr 9.28, CO2 21, AG 17, phos 11, alk phos 54, albumin 4.3, AST 19, ALT 17, CK 356, lactic acid 4.15, WBC 11, Hgb 12.6 and platelets 274.  The patient was treated for hyperkalemia and aggressively volume resuscitated.  After 2L NS, BP improved to 34'K systolic.  He was  admitted per PCCM for further evaluation.    The patient was treated with bicarbonate gtt, kayexalate and IVF hydration.  Repeat labs showed improvement in renal function.  Troponin was mildly elevated (peak 1.02), thought to be related to demand ischemia in the setting of hypotension.  EKG demonstrated no acute changes.  He was evaluated by Nephrology in the setting of AKI.  Home metformin and ACE-I was held with renal injury. Initial CK was normal but urinalysis was suggestive of a degree of rhabdomyolysis.  FENa 0.7 which was suggestive of pre-renal azotemia.  Renal ultrasound was negative for hydronephrosis. He developed fever to 102.6 overnight 7/29 which resolved with tylenol.  Blood cultures obtained and pending at time of discharge (negative to date).  ECHO completed but results pending at time of discharge.   The patient is ambulatory and reports significant improvement and feels comfortable with follow up with PCP.  The patient was medically cleared 7/31 for discharge with plans as above.               STUDIES:  7/28 renal US >>> negative  CULTURES: MRSA 7/28 >> negative BCx2 7/30 >>   ANTIBIOTICS: None  SIGNIFICANT EVENTS: 7/28  Admit for AKI, hyperk, pos fluid balnce 7/29  Fever to 102.6, resolved with tylenol  7/30  Good UOP, AKI resolving    Discharge Exam: General:  well developed adult male in NAD, up walking around room  Neuro:  CN grossly intact. Non-focal, MAE HEENT:  PERLA, MM pink/moist, no jvd Cardiovascular:  s1 s2 rrr, no m/r/g Lungs:  even/non-labored, lungs bilaterally clear  Abdomen:  Soft, NT, (-) masses/tenderness.  Musculoskeletal:  (-) edema/clubbing/cyanosis Skin:  Warm and dry. (-) rashes or lesions  Vitals:   02/08/16 0326 02/08/16 0500 02/08/16 0749 02/08/16 1143  BP:  138/69 (!) 144/80 136/82  Pulse:  91 88 83  Resp:      Temp:  97.9 F (36.6 C) 97.8 F (36.6 C) 98.3 F (36.8 C)  TempSrc:  Oral Oral Oral  SpO2:  99% 100% 99%  Weight: 270 lb 1.6 oz (122.5 kg)     Height:         Discharge Labs  BMET  Recent Labs Lab 02/05/16 1400  02/06/16 0257 02/06/16 1055 02/06/16 1502 02/07/16 0430 02/07/16 0950 02/08/16 0444 02/08/16 0839  NA 134*  < > 133* 135 136 140  --  141  --   K 6.8*  < > 6.2* 5.9* 5.1 5.3*  --  4.5  --   CL 96*  < > 102 107 105 107  --  108  --   CO2 21*  < > 23 21* 22 26  --  27  --   GLUCOSE 98  < > 112* 149* 129* 132*  --  106*  --   BUN 78*  < > 75* 72* 71* 45*  --  21*  --   CREATININE 9.28*  < > 6.71* 4.88* 4.35* 1.55*  --  0.85  --   CALCIUM 8.2*  < > 6.9* 7.2* 7.2* 8.2*  --  8.1*  --   MG  --   --  1.9  --   --   --   --   --   --   PHOS 11.0*  --  8.2*  --   --   --  2.3*  --  2.2*  < > = values in this interval not displayed.  CBC  Recent Labs Lab 02/06/16  0257 02/07/16 1447 02/08/16 0444  HGB  10.1* 11.0* 9.9*  HCT 30.7* 34.2* 29.1*  WBC 10.8* 5.4 4.3  PLT 178 175 153       Medication List    STOP taking these medications   celecoxib 200 MG capsule Commonly known as:  CELEBREX   glipiZIDE 5 MG tablet Commonly known as:  GLUCOTROL   ibuprofen 200 MG tablet Commonly known as:  ADVIL,MOTRIN   lisinopril 20 MG tablet Commonly known as:  PRINIVIL,ZESTRIL   pioglitazone-metformin 15-850 MG tablet Commonly known as:  ACTOPLUS MET   TRULICITY 1.5 UN/2.7MB Sopn Generic drug:  Dulaglutide     TAKE these medications   ALPRAZolam 0.5 MG tablet Commonly known as:  XANAX Take 0.5 mg by mouth 2 (two) times daily as needed for anxiety.   aspirin EC 81 MG tablet Take 81 mg by mouth daily.   FISH OIL PO Take 1 capsule by mouth 3 (three) times daily.   gabapentin 300 MG capsule Commonly known as:  NEURONTIN Take 300 mg by mouth at bedtime.   levothyroxine 150 MCG tablet Commonly known as:  SYNTHROID, LEVOTHROID Take 150 mcg by mouth daily before breakfast.   multivitamin capsule Take 1 capsule by mouth daily.   sertraline 50 MG tablet Commonly known as:  ZOLOFT Take 50 mg by mouth daily.   SUBOXONE 8-2 MG Film Generic drug:  Buprenorphine HCl-Naloxone HCl Take 1 Film by mouth 2 (two) times daily.   testosterone cypionate 200 MG/ML injection Commonly known as:  DEPOTESTOSTERONE CYPIONATE Inject 200 mg into the muscle every 14 (fourteen) days.   tiZANidine 4 MG capsule Commonly known as:  ZANAFLEX Take 4 mg by mouth 3 (three) times daily as needed for muscle spasms.   VIAGRA 100 MG tablet Generic drug:  sildenafil Take 100 mg by mouth daily as needed for erectile dysfunction.         Disposition:  Home.  No new home health needs identified prior to discharge.    Discharged Condition: Scott Simmons has met maximum benefit of inpatient care and is medically stable and cleared for discharge.  Patient is pending follow up as above.      Time spent  on disposition:  Greater than 35 minutes.   Signed: Noe Gens, NP-[facility] Pgr: [phone] Office: [phone]  Patient seen and examined, agree with above note.  I dictated the care and orders written for this patient under my direction.  Rush Farmer, MD [phone]

## 2016-02-08 NOTE — Progress Notes (Signed)
Echocardiogram 2D Echocardiogram has been performed.  Aggie Cosier 02/08/2016, 9:16 AM

## 2016-02-08 NOTE — Discharge Instructions (Signed)
Acute Kidney Injury °Acute kidney injury is any condition in which there is sudden (acute) damage to the kidneys. Acute kidney injury was previously known as acute kidney failure or acute renal failure. The kidneys are two organs that lie on either side of the spine between the middle of the back and the front of the abdomen. The kidneys: °· Remove wastes and extra water from the blood.   °· Produce important hormones. These help keep bones strong, regulate blood pressure, and help create red blood cells.   °· Balance the fluids and chemicals in the blood and tissues. °A small amount of kidney damage may not cause problems, but a large amount of damage may make it difficult or impossible for the kidneys to work the way they should. Acute kidney injury may develop into long-lasting (chronic) kidney disease. It may also develop into a life-threatening disease called end-stage kidney disease. Acute kidney injury can get worse very quickly, so it should be treated right away. Early treatment may prevent other kidney diseases from developing. °CAUSES  °· A problem with blood flow to the kidneys. This may be caused by:   °¨ Blood loss.   °¨ Heart disease.   °¨ Severe burns.   °¨ Liver disease. °· Direct damage to the kidneys. This may be caused by: °¨ Some medicines.   °¨ A kidney infection.   °¨ Poisoning or consuming toxic substances.   °¨ A surgical wound.   °¨ A blow to the kidney area.   °· A problem with urine flow. This may be caused by:   °¨ Cancer.   °¨ Kidney stones.   °¨ An enlarged prostate. °SIGNS AND SYMPTOMS  °· Swelling (edema) of the legs, ankles, or feet.   °· Tiredness (lethargy).   °· Nausea or vomiting.   °· Confusion.   °· Problems with urination, such as:   °¨ Painful or burning feeling during urination.   °¨ Decreased urine production.   °¨ Frequent accidents in children who are potty trained.   °¨ Bloody urine.   °· Muscle twitches and cramps.   °· Shortness of breath.   °· Seizures.   °· Chest  pain or pressure. °Sometimes, no symptoms are present.  °DIAGNOSIS °Acute kidney injury may be detected and diagnosed by tests, including blood, urine, imaging, or kidney biopsy tests.  °TREATMENT °Treatment of acute kidney injury varies depending on the cause and severity of the kidney damage. In mild cases, no treatment may be needed. The kidneys may heal on their own. If acute kidney injury is more severe, your health care provider will treat the cause of the kidney damage, help the kidneys heal, and prevent complications from occurring. Severe cases may require a procedure to remove toxic wastes from the body (dialysis) or surgery to repair kidney damage. Surgery may involve:  °· Repair of a torn kidney.   °· Removal of an obstruction. °HOME CARE INSTRUCTIONS °· Follow your prescribed diet. °· Take medicines only as directed by your health care provider.  °· Do not take any new medicines (prescription, over-the-counter, or nutritional supplements) unless approved by your health care provider. Many medicines can worsen your kidney damage or may need to have the dose adjusted.   °· Keep all follow-up visits as directed by your health care provider. This is important. °· Observe your condition to make sure you are healing as expected. °SEEK IMMEDIATE MEDICAL CARE IF: °· You are feeling ill or have severe pain in the back or side.   °· Your symptoms return or you have new symptoms. °· You have any symptoms of end-stage kidney disease. These include:   °¨ Persistent itchiness.   °¨   Loss of appetite.   °¨ Headaches.   °¨ Abnormally dark or light skin. °¨ Numbness in the hands or feet.   °¨ Easy bruising.   °¨ Frequent hiccups.   °¨ Menstruation stops.   °· You have a fever. °· You have increased urine production. °· You have pain or bleeding when urinating. °MAKE SURE YOU:  °· Understand these instructions. °· Will watch your condition. °· Will get help right away if you are not doing well or get worse. °  °This  information is not intended to replace advice given to you by your health care provider. Make sure you discuss any questions you have with your health care provider. °  °Document Released: 01/10/2011 Document Revised: 07/18/2014 Document Reviewed: 02/24/2012 °Elsevier Interactive Patient Education ©2016 [employer] ° °

## 2016-02-08 NOTE — Care Management (Signed)
Referral Received for : Please ask [facility] to review home CPAP machine. Patient reports humidity on machine is broken.   CM did call [employer] in reference to above referral. [employer] to call pt with concerns. Per Huey Romans, pt is eligible for new machine. Pt to contact PCP for order and they will fax to [place] for new machine. No further needs from CM at this time.

## 2016-02-08 NOTE — Progress Notes (Signed)
Subjective: Interval History: Feeling much better today. Headache resolved with caffeine (reports high caffeine intake outpatient). No further fevers. Good UOP.   Objective: Vital signs in last 24 hours: Temp:  [97.8 F (36.6 C)-98.6 F (37 C)] 97.8 F (36.6 C) (07/31 0749) Pulse Rate:  [88-106] 88 (07/31 0749) Resp:  [18] 18 (07/30 1800) BP: (128-144)/(64-80) 144/80 (07/31 0749) SpO2:  [98 %-100 %] 100 % (07/31 0749) Weight:  [270 lb 1.6 oz (122.5 kg)] 270 lb 1.6 oz (122.5 kg) (07/31 0326) Weight change: -1 lb 3.2 oz (-0.544 kg)  Intake/Output from previous day: 07/30 0701 - 07/31 0700 In: 2540 [P.O.:2040; I.V.:500] Out: 1600 [Urine:1600] Intake/Output this shift: Total I/O In: 240 [P.O.:240] Out: -   PHYSICAL EXAM GENERAL- alert, no acute distress HEENT- Atraumatic, normocephalic, PERRL, EOMI, oral mucosa appears moist CARDIAC- regular rate, normal rhythm, no murmurs, rubs or gallops. RESP- Moving equal volumes of air, and clear to auscultation bilaterally, no wheezes or crackles. ABDOMEN- Soft, nontender, bowel sounds present. NEURO- AAOx3, No obvious Cr N abnormality. EXTREMITIES- pulse 2+, symmetric, no pedal edema. SKIN- Warm, dry, No rash or lesion. PSYCH- Normal mood and affect, appropriate thought content and speech.  Medications . insulin aspart  0-9 Units Subcutaneous Q4H  . levothyroxine  150 mcg Oral QAC breakfast  . sodium phosphate  Dextrose 5% IVPB  10 mmol Intravenous Once     Assessment/Plan: 1 AKI - Renal function back to near baselien. Cr 1.55 > 0.85 (baseline ~0.6). Urine output with ~1.6L out yesterday. Blood pressures have been stable. Tachycardia resolved. Most likely drug induced (NSAID, ACE-I) in setting of volume depletion but not classic history. Would continue to hold ACE-I at discharge as BP have been borderline here and follow up with PCP in 1 week. Discussed limiting NSAID use. Clear for discharge from renal standpoint. Will sign off.    2. New Fever: Patient febrile to 102.6 on 7/29. No further fevers or leukocytosis. No obvious signs of infection. CXR on admission clear, UA clear. Blood cultures pending.  3. Hyperkalemia - Resolved 4. Hypophos - repletion  5 Hypotension/Sinus tachy 2/2 volume depeletion: Lactic acidosis resolved with IVF. BPs stable. Tachycardia resolved.  6. Acidosis: Resolved  7. Elevated troponin: Troponin 0.26>1.03>0.6. EKG with no STEMI. Likely demand. ECHO pending. 8. DM  9. Hypothyroid 10. obesity   Maryellen Pile 02/08/2016,11:41 AM   I have seen and examined this patient and agree with plan and assessment in the above note. Kidney function is back to baseline.  BP stable on no meds,. Should see PCP in a week and BP meds (not lisinopril) could be restarted at that time if need be. Have advised pt to limit NSAID use. Renal will sign off.   Sherron Mapp B,MD 02/08/2016 12:11 PM

## 2016-02-09 LAB — T3, FREE: T3 FREE: 2.4 pg/mL (ref 2.0–4.4)

## 2016-02-12 LAB — CULTURE, BLOOD (ROUTINE X 2)
CULTURE: NO GROWTH
CULTURE: NO GROWTH

## 2017-11-16 ENCOUNTER — Emergency Department (HOSPITAL_COMMUNITY)
Admission: EM | Admit: 2017-11-16 | Discharge: 2017-11-17 | Disposition: A | Discharge status: Home / Self Care | Payer: Managed Care, Other (non HMO) | Attending: Emergency Medicine | Admitting: Emergency Medicine

## 2017-11-16 ENCOUNTER — Encounter (HOSPITAL_COMMUNITY): Payer: Self-pay

## 2017-11-16 ENCOUNTER — Emergency Department (HOSPITAL_COMMUNITY): Payer: Managed Care, Other (non HMO)

## 2017-11-16 DIAGNOSIS — R072 Precordial pain: Secondary | ICD-10-CM | POA: Diagnosis present

## 2017-11-16 LAB — CBC
HEMATOCRIT: 44.6 % (ref 39.0–52.0)
Hemoglobin: 15.3 g/dL (ref 13.0–17.0)
MCH: 31.5 pg (ref 26.0–34.0)
MCHC: 34.3 g/dL (ref 30.0–36.0)
MCV: 91.8 fL (ref 78.0–100.0)
Platelets: 254 10*3/uL (ref 150–400)
RBC: 4.86 MIL/uL (ref 4.22–5.81)
RDW: 13.4 % (ref 11.5–15.5)
WBC: 6.1 10*3/uL (ref 4.0–10.5)

## 2017-11-16 LAB — BASIC METABOLIC PANEL
Anion gap: 10 (ref 5–15)
BUN: 20 mg/dL (ref 6–20)
CHLORIDE: 98 mmol/L — AB (ref 101–111)
CO2: 30 mmol/L (ref 22–32)
Calcium: 8.8 mg/dL — ABNORMAL LOW (ref 8.9–10.3)
Creatinine, Ser: 0.85 mg/dL (ref 0.61–1.24)
GFR calc Af Amer: >60 mL/min (ref >60)
GFR calc non Af Amer: >60 mL/min (ref >60)
GLUCOSE: 233 mg/dL — AB (ref 65–99)
Potassium: 4 mmol/L (ref 3.5–5.1)
Sodium: 138 mmol/L (ref 135–145)

## 2017-11-16 NOTE — ED Triage Notes (Signed)
Pt states that for the past week he has been having CP, central, tightness, SOB, headache, saw PCP for the same today.

## 2017-11-16 NOTE — ED Provider Notes (Signed)
Patient placed in Quick Look pathway, seen and evaluated   Chief Complaint: Chest pain  HPI:   Patient reports having generalized chest pain that does not radiate.  Reports some mild shortness of breath.  Ongoing for the past week.  Pain is been pretty constant.  Not pleuritic or exertional in nature.  States that it is a tightness.  Worse when he gets up in the morning.  Denies any history of PE/DVT, prolonged immobilization, recent hospitalizations or surgeries, unilateral leg swelling or calf tenderness.  Patient reports some nausea but no vomiting.  ROS: Chest pain, shortness of breath, headache (one)  Physical Exam:   Gen: No distress  Neuro: Awake and Alert  Skin: Warm    Focused Exam: Heart regular rate and rhythm.  Lungs clear to auscultation bilaterally.  Neurovascular intact in all extremities.  Abdominal exam benign.  No lower extremity edema or calf tenderness.   Initiation of care has begun. The patient has been counseled on the process, plan, and necessity for staying for the completion/evaluation, and the remainder of the medical screening examination   Discussed with the patient that exiting the department prior to completion of the work-up is AMA and there is no guarantee that there are no emergency medical conditions present.    Doristine Devoid, PA-C 11/16/17 2055    Carmin Muskrat, MD 11/16/17 [phone]

## 2017-11-17 LAB — I-STAT TROPONIN, ED: Troponin i, poc: 0 ng/mL (ref 0.00–0.08)

## 2017-11-17 MED ORDER — SODIUM CHLORIDE 0.9 % IV BOLUS
1000.0000 mL | Freq: Once | INTRAVENOUS | Status: AC
  Administered 2017-11-17: 1000 mL via INTRAVENOUS

## 2017-11-17 MED ORDER — GI COCKTAIL ~~LOC~~
30.0000 mL | Freq: Once | ORAL | Status: AC
Start: 2017-11-17 — End: 2017-11-17
  Administered 2017-11-17: 30 mL via ORAL
  Filled 2017-11-17: qty 30

## 2017-11-29 NOTE — Progress Notes (Signed)
Cardiology Office Note:    Date:  11/30/2017   ID:  Scott Simmons, DOB 1965/09/04, MRN [id number]  PCP:  Wenda Low, MD  Cardiologist:  Shirlee More, MD   Referring MD: Wenda Low, MD  ASSESSMENT:    1. Chest pain, unspecified type   2. Essential hypertension   3. Type 2 diabetes mellitus without complication, without long-term current use of insulin (HCC)    PLAN:    In order of problems listed above:  1. He is having typical anginal symptoms for the last few weeks [place] classification 2.  He is not having acute coronary syndrome troponin was normal I will add a beta-blocker to his medical therapy initiate statin treatment and await results of stress echo if he has high risk markers diminished exercise tolerance hypotensive blood pressure response markedly positive EKG he would be appropriate for coronary angiography.  If symptoms worsen I asked him to contact my office 2. Stable blood pressure target continue current treatment calcium channel blocker 3. Stable managed by his PCP  Next appointment   Medication Adjustments/Labs and Tests Ordered: Current medicines are reviewed at length with the patient today.  Concerns regarding medicines are outlined above.  Orders Placed This Encounter  Procedures  . CT CORONARY MORPH W/CTA COR W/SCORE W/CA W/CM &/OR WO/CM  . CT CORONARY FRACTIONAL FLOW RESERVE DATA PREP  . CT CORONARY FRACTIONAL FLOW RESERVE FLUID ANALYSIS  . Basic metabolic panel   Meds ordered this encounter  Medications  . pravastatin (PRAVACHOL) 20 MG tablet    Sig: Take 1 tablet (20 mg total) by mouth every evening.    Dispense:  90 tablet    Refill:  2  . nitroGLYCERIN (NITROSTAT) 0.4 MG SL tablet    Sig: Place 1 tablet (0.4 mg total) under the tongue every 5 (five) minutes as needed.    Dispense:  25 tablet    Refill:  11  . metoprolol succinate (TOPROL-XL) 50 MG 24 hr tablet    Sig: Take 1 tablet (50 mg total) by mouth daily. Take with or  immediately following a meal.    Dispense:  90 tablet    Refill:  2     Chief Complaint  Patient presents with  . New Patient (Initial Visit)  . Chest Pain    History of Present Illness:    Scott Simmons is a 52 y.o. male T2 DM hypertension and hyperlipidemia who is being seen today for the evaluation of chest pain at the request of Wenda Low, MD. He has a background history and Graves' disease and mild CAD and outpatient catheterization somewhere in the range of 15 years ago records are not available.  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  The chest pain is moderate has not forced to stop his activities but is been persistent in the last few weeks has not occurred at rest or nocturnally.  He has no previous pattern of typical angina.  He has palpitation but not severe sustained he is not short of breath no syncope or TIA.  His diabetes and hypertension are controlled he is uncertain of his lipid status.  His chest pain has no radiation no associated diaphoresis nausea or vomiting  Past Medical History:  Diagnosis Date  . Acute renal disease   .  Acute renal failure with tubular necrosis ([place]) 02/05/2016  . AKI (acute kidney injury) ([place]) 02/05/2016  . Arterial hypotension   . Chest pain 03/19/2014   Chest pain   . Diabetes ([place])   . Essential hypertension 03/19/2014   hypertension   . Hyperkalemia   . Hypertension   . Hypotension   . Lumbar disc disease    slipped disc  . Sleep apnea    cpap broke  . Slipped cervical disc   . SOB (shortness of breath)   . Tachycardia 07/18/2014  . Type 2 diabetes mellitus ([place]) 07/18/2014    Past Surgical History:  Procedure Laterality Date  . COLONOSCOPY WITH PROPOFOL N/A 11/09/2015   Procedure: COLONOSCOPY WITH PROPOFOL;  Surgeon: Garlan Fair, MD;   Location: [facility];  Service: Endoscopy;  Laterality: N/A;  . ESOPHAGOGASTRODUODENOSCOPY (EGD) WITH PROPOFOL N/A 11/09/2015   Procedure: ESOPHAGOGASTRODUODENOSCOPY (EGD) WITH PROPOFOL;  Surgeon: Garlan Fair, MD;  Location: [facility];  Service: Endoscopy;  Laterality: N/A;  . KNEE SURGERY Left    acl repair  . SHOULDER SURGERY Right    arthroscopic    Current Medications: Current Meds  Medication Sig  . amLODipine (NORVASC) 5 MG tablet Take 5 mg by mouth daily.  Marland Kitchen aspirin EC 81 MG tablet Take 81 mg by mouth daily.  . busPIRone (BUSPAR) 10 MG tablet Take 10 mg by mouth 2 (two) times daily.  . celecoxib (CELEBREX) 200 MG capsule Take 200 mg by mouth daily.  Marland Kitchen gabapentin (NEURONTIN) 300 MG capsule Take 300 mg by mouth at bedtime.  Marland Kitchen levothyroxine (SYNTHROID, LEVOTHROID) 150 MCG tablet Take 150 mcg by mouth daily before breakfast.  . Multiple Vitamin (MULTIVITAMIN) capsule Take 1 capsule by mouth daily.  . Omega-3 Fatty Acids (FISH OIL PO) Take 1 capsule by mouth 3 (three) times daily.   . pioglitazone-metformin (ACTOPLUS MET) 15-850 MG tablet Take 1 tablet by mouth 2 (two) times daily with a meal.   . SUBOXONE 8-2 MG FILM Take 1 Film by mouth 2 (two) times daily.   Marland Kitchen testosterone cypionate (DEPOTESTOTERONE CYPIONATE) 200 MG/ML injection Inject 200 mg into the muscle every 7 (seven) days.   Marland Kitchen tiZANidine (ZANAFLEX) 4 MG capsule Take 4 mg by mouth 3 (three) times daily as needed for muscle spasms.   . TRULICITY 1.5 JJ/0.0XF SOPN Inject 1.5 mg as directed once a week.  . venlafaxine XR (EFFEXOR-XR) 150 MG 24 hr capsule Take 150 mg by mouth at bedtime.   Marland Kitchen VIAGRA 100 MG tablet Take 100 mg by mouth daily as needed for erectile dysfunction.      Allergies:   Patient has no known allergies.   Social History   Socioeconomic History  . Marital status: Married    Spouse name: Not on file  . Number of children: 3  . Years of education: 51  . Highest education level: Not on file    Occupational History  . Occupation: Copywriter, advertising: [employer]  . Financial resource strain: Not on file  . Food insecurity:    Worry: Not on file    Inability: Not on file  . Transportation needs:    Medical: Not on file    Non-medical: Not on file  Tobacco Use  . Smoking status: Never Smoker  . Smokeless tobacco: Never Used  Substance and Sexual Activity  . Alcohol use: No  . Drug use: No  . Sexual activity: Not on file  Lifestyle  . Physical activity:    Days per week: Not on file    Minutes per session: Not on file  . Stress: Not on file  Relationships  . Social connections:    Talks on phone: Not on file    Gets together: Not on file    Attends religious service: Not on file    Active member of club or organization: Not on file    Attends meetings of clubs or organizations: Not on file    Relationship status: Not on file  Other Topics Concern  . Not on file  Social History Narrative  . Not on file     Family History: The patient's family history includes Atrial fibrillation in his father; Diabetes in his paternal grandfather and paternal grandmother; Heart attack in his brother; Heart murmur in his paternal grandfather; Lung cancer in his paternal grandfather; Mental retardation in his other; Stroke in his paternal grandfather and paternal grandmother; Thyroid cancer in his brother.  ROS:   Review of Systems  Constitution: Negative.  HENT: Negative.   Eyes: Negative.   Cardiovascular: Positive for chest pain and palpitations (not severe or sustained).  Respiratory: Negative.   Endocrine: Negative.   Hematologic/Lymphatic: Negative.   Musculoskeletal: Positive for back pain, joint pain and neck pain.  Gastrointestinal: Negative.   Genitourinary: Negative.   Neurological: Negative.   Psychiatric/Behavioral: Negative.   Allergic/Immunologic: Negative.    Please see the history of present illness.     All other systems  reviewed and are negative.  EKGs/Labs/Other Studies Reviewed:    The following studies were reviewed today:   EKG:  11/17/17 [facility] nonspecific T waves MPI 04/02/14:  Overall Impression:  Normal stress nuclear study. LV Wall Motion:  NL LV Function EF 60%; NL Wall Motion Recent Labs:   Ref Range & Units 12d ago 7yrago  Troponin i, poc 0.00 - 0.08 ng/mL 0.00  0.00     11/16/2017: BUN 20; Creatinine, Ser 0.85; Hemoglobin 15.3; Platelets 254; Potassium 4.0; Sodium 138  Recent Lipid Panel    Component Value Date/Time   CHOL  03/08/2009 0500    105        ATP III CLASSIFICATION:  <200     mg/dL   Desirable  200-239  mg/dL   Borderline High  >=240    mg/dL   High          TRIG 82 03/08/2009 0500   HDL 43 03/08/2009 0500   CHOLHDL 2.4 03/08/2009 0500   VLDL 16 03/08/2009 0500   LDLCALC  03/08/2009 0500    46        Total Cholesterol/HDL:CHD Risk Coronary Heart Disease Risk Table                     Men   Women  1/2 Average Risk   3.4   3.3  Average Risk       5.0   4.4  2 X Average Risk   9.6   7.1  3 X Average Risk  23.4   11.0        Use the calculated Patient Ratio above and the CHD Risk Table to determine the patient's CHD Risk.        ATP III CLASSIFICATION (LDL):  <100     mg/dL   Optimal  100-129  mg/dL   Near or Above  Optimal  130-159  mg/dL   Borderline  160-189  mg/dL   High  >190     mg/dL   Very High    Physical Exam:    VS:  BP (!) 158/84 (BP Location: Left Arm, Patient Position: Sitting, Cuff Size: Large)   Pulse 98   Ht _0  (1.778 m)   Wt 276 lb 6.4 oz (125.4 kg)   SpO2 98%   BMI 39.66 kg/m     Wt Readings from Last 3 Encounters:  11/30/17 276 lb 6.4 oz (125.4 kg)  02/08/16 270 lb 1.6 oz (122.5 kg)  11/09/15 229 lb (103.9 kg)     GEN:  Well nourished, well developed in no acute distress HEENT: Normal NECK: No JVD; No carotid bruits LYMPHATICS: No lymphadenopathy CARDIAC: RRR, no murmurs, rubs, gallops RESPIRATORY:   Clear to auscultation without rales, wheezing or rhonchi  ABDOMEN: Soft, non-tender, non-distended MUSCULOSKELETAL:  No edema; No deformity  SKIN: Warm and dry NEUROLOGIC:  Alert and oriented x 3 PSYCHIATRIC:  Normal affect     Signed, Shirlee More, MD  11/30/2017 12:08 PM    Welcome

## 2017-11-30 ENCOUNTER — Encounter: Payer: Self-pay | Admitting: Cardiology

## 2017-11-30 ENCOUNTER — Ambulatory Visit: Payer: Managed Care, Other (non HMO) | Admitting: Cardiology

## 2017-11-30 VITALS — BP 158/84 | HR 98 | Ht 70.0 in | Wt 276.4 lb

## 2017-11-30 DIAGNOSIS — E119 Type 2 diabetes mellitus without complications: Secondary | ICD-10-CM

## 2017-11-30 DIAGNOSIS — R079 Chest pain, unspecified: Secondary | ICD-10-CM | POA: Diagnosis not present

## 2017-11-30 DIAGNOSIS — I10 Essential (primary) hypertension: Secondary | ICD-10-CM

## 2017-11-30 MED ORDER — PRAVASTATIN SODIUM 20 MG PO TABS: 20.0000 mg | ORAL_TABLET | Freq: Every evening | ORAL | 2 refills | Status: AC

## 2017-11-30 MED ORDER — METOPROLOL SUCCINATE ER 50 MG PO TB24: 50.0000 mg | ORAL_TABLET | Freq: Every day | ORAL | 2 refills | Status: DC

## 2017-11-30 MED ORDER — NITROGLYCERIN 0.4 MG SL SUBL: 0.4000 mg | SUBLINGUAL_TABLET | SUBLINGUAL | 11 refills | Status: AC | PRN

## 2017-11-30 NOTE — Patient Instructions (Addendum)
Medication Instructions:  Your physician has recommended you make the following change in your medication:  START Nitroglycerin 0.4 mg sublingual (under your tongue) as needed for chest pain. If experiencing chest pain, stop what you are doing and sit down. Take 1 nitroglycerin and wait 5 minutes. If chest pain continues, take another nitroglycerin and wait 5 minutes. If chest pain does not subside, take 1 more nitroglycerin and dial 911. You make take a total of 3 nitroglycerin in a 15 minute time frame. START pravastatin 20 mg every evening START toprol 50 mg daily  Labwork: Your physician recommends that you have the following labs drawn: BMP to be completed at least 3 days before cardiac CT.  Testing/Procedures: Your physician has requested that you have cardiac CT. Cardiac computed tomography (CT) is a painless test that uses an x-ray machine to take clear, detailed pictures of your heart. For further information please visit [email or web address]. Please follow instruction sheet as given.   Please arrive at the [facility] main entrance of [facility] at TBD (30-45 minutes prior to test start time)  [facility], [facility] [address], [place] [address] [phone]  Proceed to the [facility] Radiology Department (First Floor).  Please follow these instructions carefully (unless otherwise directed):  Hold all erectile dysfunction medications at least 48 hours prior to test.  On the Night Before the Test: . Drink plenty of water. . Do not consume any caffeinated/decaffeinated beverages or chocolate 12 hours prior to your test. . Do not take any antihistamines 12 hours prior to your test.  Do not take trulicity the morning of the testing.  On the Day of the Test: . Drink plenty of water. Do not drink any water within one hour of the test. . Do not eat any food 4 hours prior to the test. . You may take your regular medications prior to the test. . IF NOT ON  A BETA BLOCKER - Take 50 mg of lopressor (metoprolol) one hour before the test. . HOLD Furosemide morning of the test.  After the Test: . Drink plenty of water. . After receiving IV contrast, you may experience a mild flushed feeling. This is normal. . On occasion, you may experience a mild rash up to 24 hours after the test. This is not dangerous. If this occurs, you can take Benadryl 25 mg and increase your fluid intake. . If you experience trouble breathing, this can be serious. If it is severe call 911 IMMEDIATELY. If it is mild, please call our office. . If you take any of these medications: Glipizide/Metformin, Avandament, Glucavance, please do not take 48 hours after completing test.   Follow-Up: Your physician recommends that you schedule a follow-up appointment in: 6 weeks  Any Other Special Instructions Will Be Listed Below (If Applicable).     If you need a refill on your cardiac medications before your next appointment, please call your pharmacy.   [place], RN, BSN  Nitroglycerin sublingual tablets What is this medicine? NITROGLYCERIN (nye troe GLI ser in) is a type of vasodilator. It relaxes blood vessels, increasing the blood and oxygen supply to your heart. This medicine is used to relieve chest pain caused by angina. It is also used to prevent chest pain before activities like climbing stairs, going outdoors in cold weather, or sexual activity. This medicine may be used for other purposes; ask your health care provider or pharmacist if you have questions. COMMON BRAND NAME(S):  Nitroquick, Nitrostat, Nitrotab What should I tell my health care provider before I take this medicine? They need to know if you have any of these conditions: -anemia -head injury, recent stroke, or bleeding in the brain -liver disease -previous heart attack -an unusual or allergic reaction to nitroglycerin, other medicines, foods, dyes, or preservatives -pregnant or  trying to get pregnant -breast-feeding How should I use this medicine? Take this medicine by mouth as needed. At the first sign of an angina attack (chest pain or tightness) place one tablet under your tongue. You can also take this medicine 5 to 10 minutes before an event likely to produce chest pain. Follow the directions on the prescription label. Let the tablet dissolve under the tongue. Do not swallow whole. Replace the dose if you accidentally swallow it. It will help if your mouth is not dry. Saliva around the tablet will help it to dissolve more quickly. Do not eat or drink, smoke or chew tobacco while a tablet is dissolving. If you are not better within 5 minutes after taking ONE dose of nitroglycerin, call 9-1-1 immediately to seek emergency medical care. Do not take more than 3 nitroglycerin tablets over 15 minutes. If you take this medicine often to relieve symptoms of angina, your doctor or health care professional may provide you with different instructions to manage your symptoms. If symptoms do not go away after following these instructions, it is important to call 9-1-1 immediately. Do not take more than 3 nitroglycerin tablets over 15 minutes. Talk to your pediatrician regarding the use of this medicine in children. Special care may be needed. Overdosage: If you think you have taken too much of this medicine contact a poison control center or emergency room at once. NOTE: This medicine is only for you. Do not share this medicine with others. What if I miss a dose? This does not apply. This medicine is only used as needed. What may interact with this medicine? Do not take this medicine with any of the following medications: -certain migraine medicines like ergotamine and dihydroergotamine (DHE) -medicines used to treat erectile dysfunction like sildenafil, tadalafil, and vardenafil -riociguat This medicine may also interact with the following  medications: -alteplase -aspirin -heparin -medicines for high blood pressure -medicines for mental depression -other medicines used to treat angina -phenothiazines like chlorpromazine, mesoridazine, prochlorperazine, thioridazine This list may not describe all possible interactions. Give your health care provider a list of all the medicines, herbs, non-prescription drugs, or dietary supplements you use. Also tell them if you smoke, drink alcohol, or use illegal drugs. Some items may interact with your medicine. What should I watch for while using this medicine? Tell your doctor or health care professional if you feel your medicine is no longer working. Keep this medicine with you at all times. Sit or lie down when you take your medicine to prevent falling if you feel dizzy or faint after using it. Try to remain calm. This will help you to feel better faster. If you feel dizzy, take several deep breaths and lie down with your feet propped up, or bend forward with your head resting between your knees. You may get drowsy or dizzy. Do not drive, use machinery, or do anything that needs mental alertness until you know how this drug affects you. Do not stand or sit up quickly, especially if you are an older patient. This reduces the risk of dizzy or fainting spells. Alcohol can make you more drowsy and dizzy. Avoid alcoholic drinks. Do  not treat yourself for coughs, colds, or pain while you are taking this medicine without asking your doctor or health care professional for advice. Some ingredients may increase your blood pressure. What side effects may I notice from receiving this medicine? Side effects that you should report to your doctor or health care professional as soon as possible: -blurred vision -dry mouth -skin rash -sweating -the feeling of extreme pressure in the head -unusually weak or tired Side effects that usually do not require medical attention (report to your doctor or health care  professional if they continue or are bothersome): -flushing of the face or neck -headache -irregular heartbeat, palpitations -nausea, vomiting This list may not describe all possible side effects. Call your doctor for medical advice about side effects. You may report side effects to FDA at 1-800-FDA-1088. Where should I keep my medicine? Keep out of the reach of children. Store at room temperature between 20 and 25 degrees C (68 and 77 degrees F). Store in [facility] [employer]. Protect from light and moisture. Keep tightly closed. Throw away any unused medicine after the expiration date. NOTE: This sheet is a summary. It may not cover all possible information. If you have questions about this medicine, talk to your doctor, pharmacist, or health care provider.  2018 Elsevier/Gold Standard (2013-04-25 17:57:36) Pravastatin tablets What is this medicine? PRAVASTATIN (PRA va stat in) is known as a HMG-CoA reductase inhibitor or 'statin'. It lowers the level of cholesterol and triglycerides in the blood. This drug may also reduce the risk of heart attack, stroke, or other health problems in patients with risk factors for heart disease. Diet and lifestyle changes are often used with this drug. This medicine may be used for other purposes; ask your health care provider or pharmacist if you have questions. COMMON BRAND NAME(S): Pravachol What should I tell my health care provider before I take this medicine? They need to know if you have any of these conditions: -frequently drink alcoholic beverages -kidney disease -liver disease -muscle aches or weakness -other medical condition -an unusual or allergic reaction to pravastatin, other medicines, foods, dyes, or preservatives -pregnant or trying to get pregnant -breast-feeding How should I use this medicine? Take pravastatin tablets by mouth. Swallow the tablets with a drink of water. Pravastatin can be taken at anytime of the day, with or without  food. Follow the directions on the prescription label. Take your doses at regular intervals. Do not take your medicine more often than directed. Talk to your pediatrician regarding the use of this medicine in children. Special care may be needed. Pravastatin has been used in children as young as 63 years of age. Overdosage: If you think you have taken too much of this medicine contact a poison control center or emergency room at once. NOTE: This medicine is only for you. Do not share this medicine with others. What if I miss a dose? If you miss a dose, take it as soon as you can. If it is almost time for your next dose, take only that dose. Do not take double or extra doses. What may interact with this medicine? This medicine may interact with the following medications: -colchicine -cyclosporine -other medicines for high cholesterol -some antibiotics like azithromycin, clarithromycin, erythromycin, and telithromycin This list may not describe all possible interactions. Give your health care provider a list of all the medicines, herbs, non-prescription drugs, or dietary supplements you use. Also tell them if you smoke, drink alcohol, or use illegal drugs. Some items may  interact with your medicine. What should I watch for while using this medicine? Visit your doctor or health care professional for regular check-ups. You may need regular tests to make sure your liver is working properly. Tell your doctor or health care professional right away if you get any unexplained muscle pain, tenderness, or weakness, especially if you also have a fever and tiredness. Your doctor or health care professional may tell you to stop taking this medicine if you develop muscle problems. If your muscle problems do not go away after stopping this medicine, contact your health care professional. This drug is only part of a total heart-health program. Your doctor or a dietician can suggest a low-cholesterol and low-fat diet  to help. Avoid alcohol and smoking, and keep a proper exercise schedule. Do not use this drug if you are pregnant or breast-feeding. Serious side effects to an unborn child or to an infant are possible. Talk to your doctor or pharmacist for more information. This medicine may affect blood sugar levels. If you have diabetes, check with your doctor or health care professional before you change your diet or the dose of your diabetic medicine. If you are going to have surgery tell your health care professional that you are taking this drug. What side effects may I notice from receiving this medicine? Side effects that you should report to your doctor or health care professional as soon as possible: -allergic reactions like skin rash, itching or hives, swelling of the face, lips, or tongue -dark urine -fever -muscle pain, cramps, or weakness -redness, blistering, peeling or loosening of the skin, including inside the mouth -trouble passing urine or change in the amount of urine -unusually weak or tired -yellowing of the eyes or skin Side effects that usually do not require medical attention (report to your doctor or health care professional if they continue or are bothersome): -gas -headache -heartburn -indigestion -stomach pain This list may not describe all possible side effects. Call your doctor for medical advice about side effects. You may report side effects to FDA at 1-800-FDA-1088. Where should I keep my medicine? Keep out of the reach of children. Store at room temperature between 15 to 30 degrees C (59 to 86 degrees F). Protect from light. Keep container tightly closed. Throw away any unused medicine after the expiration date. NOTE: This sheet is a summary. It may not cover all possible information. If you have questions about this medicine, talk to your doctor, pharmacist, or health care provider.  2018 Elsevier/Gold Standard (2015-01-22 16:00:27) Metoprolol extended-release  tablets What is this medicine? METOPROLOL (me TOE proe lole) is a beta-blocker. Beta-blockers reduce the workload on the heart and help it to beat more regularly. This medicine is used to treat high blood pressure and to prevent chest pain. It is also used to after a heart attack and to prevent an additional heart attack from occurring. This medicine may be used for other purposes; ask your health care provider or pharmacist if you have questions. COMMON BRAND NAME(S): toprol, Toprol XL What should I tell my health care provider before I take this medicine? They need to know if you have any of these conditions: -diabetes -heart or vessel disease like slow heart rate, worsening heart failure, heart block, sick sinus syndrome or Raynaud's disease -kidney disease -liver disease -lung or breathing disease, like asthma or emphysema -pheochromocytoma -thyroid disease -an unusual or allergic reaction to metoprolol, other beta-blockers, medicines, foods, dyes, or preservatives -pregnant or trying to get pregnant -  breast-feeding How should I use this medicine? Take this medicine by mouth with a glass of water. Follow the directions on the prescription label. Do not crush or chew. Take this medicine with or immediately after meals. Take your doses at regular intervals. Do not take more medicine than directed. Do not stop taking this medicine suddenly. This could lead to serious heart-related effects. Talk to your pediatrician regarding the use of this medicine in children. While this drug may be prescribed for children as young as 6 years for selected conditions, precautions do apply. Overdosage: If you think you have taken too much of this medicine contact a poison control center or emergency room at once. NOTE: This medicine is only for you. Do not share this medicine with others. What if I miss a dose? If you miss a dose, take it as soon as you can. If it is almost time for your next dose, take only  that dose. Do not take double or extra doses. What may interact with this medicine? This medicine may interact with the following medications: -certain medicines for blood pressure, heart disease, irregular heart beat -certain medicines for depression, like monoamine oxidase (MAO) inhibitors, fluoxetine, or paroxetine -clonidine -dobutamine -epinephrine -isoproterenol -reserpine This list may not describe all possible interactions. Give your health care provider a list of all the medicines, herbs, non-prescription drugs, or dietary supplements you use. Also tell them if you smoke, drink alcohol, or use illegal drugs. Some items may interact with your medicine. What should I watch for while using this medicine? Visit your doctor or health care professional for regular check ups. Contact your doctor right away if your symptoms worsen. Check your blood pressure and pulse rate regularly. Ask your health care professional what your blood pressure and pulse rate should be, and when you should contact them. You may get drowsy or dizzy. Do not drive, use machinery, or do anything that needs mental alertness until you know how this medicine affects you. Do not sit or stand up quickly, especially if you are an older patient. This reduces the risk of dizzy or fainting spells. Contact your doctor if these symptoms continue. Alcohol may interfere with the effect of this medicine. Avoid alcoholic drinks. What side effects may I notice from receiving this medicine? Side effects that you should report to your doctor or health care professional as soon as possible: -allergic reactions like skin rash, itching or hives -cold or numb hands or feet -depression -difficulty breathing -faint -fever with sore throat -irregular heartbeat, chest pain -rapid weight gain -swollen legs or ankles Side effects that usually do not require medical attention (report to your doctor or health care professional if they continue  or are bothersome): -anxiety or nervousness -change in sex drive or performance -dry skin -headache -nightmares or trouble sleeping -short term memory loss -stomach upset or diarrhea -unusually tired This list may not describe all possible side effects. Call your doctor for medical advice about side effects. You may report side effects to FDA at 1-800-FDA-1088. Where should I keep my medicine? Keep out of the reach of children. Store at room temperature between 15 and 30 degrees C (59 and 86 degrees F). Throw away any unused medicine after the expiration date. NOTE: This sheet is a summary. It may not cover all possible information. If you have questions about this medicine, talk to your doctor, pharmacist, or health care provider.  2018 Elsevier/Gold Standard (2013-03-01 14:41:37)  Cardiac CT Angiogram A cardiac CT angiogram is  a procedure to look at the heart and the area around the heart. It may be done to help find the cause of chest pains or other symptoms of heart disease. During this procedure, a large X-ray machine, called a CT scanner, takes detailed pictures of the heart and the surrounding area after a dye (contrast material) has been injected into blood vessels in the area. The procedure is also sometimes called a coronary CT angiogram, coronary artery scanning, or CTA. A cardiac CT angiogram allows the health care provider to see how well blood is flowing to and from the heart. The health care provider will be able to see if there are any problems, such as:  Blockage or narrowing of the coronary arteries in the heart.  Fluid around the heart.  Signs of weakness or disease in the muscles, valves, and tissues of the heart.  Tell a health care provider about:  Any allergies you have. This is especially important if you have had a previous allergic reaction to contrast dye.  All medicines you are taking, including vitamins, herbs, eye drops, creams, and over-the-counter  medicines.  Any blood disorders you have.  Any surgeries you have had.  Any medical conditions you have.  Whether you are pregnant or may be pregnant.  Any anxiety disorders, chronic pain, or other conditions you have that may increase your stress or prevent you from lying still. What are the risks? Generally, this is a safe procedure. However, problems may occur, including:  Bleeding.  Infection.  Allergic reactions to medicines or dyes.  Damage to other structures or organs.  Kidney damage from the dye or contrast that is used.  Increased risk of cancer from radiation exposure. This risk is low. Talk with your health care provider about: ? The risks and benefits of testing. ? How you can receive the lowest dose of radiation.  What happens before the procedure?  Wear comfortable clothing and remove any jewelry, glasses, dentures, and hearing aids.  Follow instructions from your health care provider about eating and drinking. This may include: ? For 12 hours before the test - avoid caffeine. This includes tea, coffee, soda, energy drinks, and diet pills. Drink plenty of water or other fluids that do not have caffeine in them. Being well-hydrated can prevent complications. ? For 4-6 hours before the test - stop eating and drinking. The contrast dye can cause nausea, but this is less likely if your stomach is empty.  Ask your health care provider about changing or stopping your regular medicines. This is especially important if you are taking diabetes medicines, blood thinners, or medicines to treat erectile dysfunction. What happens during the procedure?  Hair on your chest may need to be removed so that small sticky patches called electrodes can be placed on your chest. These will transmit information that helps to monitor your heart during the test.  An IV tube will be inserted into one of your veins.  You might be given a medicine to control your heart rate during the  test. This will help to ensure that good images are obtained.  You will be asked to lie on an exam table. This table will slide in and out of the CT machine during the procedure.  Contrast dye will be injected into the IV tube. You might feel warm, or you may get a metallic taste in your mouth.  You will be given a medicine (nitroglycerin) to relax (dilate) the arteries in your heart.  The table  that you are lying on will move into the CT machine tunnel for the scan.  The person running the machine will give you instructions while the scans are being done. You may be asked to: ? Keep your arms above your head. ? Hold your breath. ? Stay very still, even if the table is moving.  When the scanning is complete, you will be moved out of the machine.  The IV tube will be removed. The procedure may vary among health care providers and hospitals. What happens after the procedure?  You might feel warm, or you may get a metallic taste in your mouth from the contrast dye.  You may have a headache from the nitroglycerin.  After the procedure, drink water or other fluids to wash (flush) the contrast material out of your body.  Contact a health care provider if you have any symptoms of allergy to the contrast. These symptoms include: ? Shortness of breath. ? Rash or hives. ? A racing heartbeat.  Most people can return to their normal activities right after the procedure. Ask your health care provider what activities are safe for you.  It is up to you to get the results of your procedure. Ask your health care provider, or the department that is doing the procedure, when your results will be ready. Summary  A cardiac CT angiogram is a procedure to look at the heart and the area around the heart. It may be done to help find the cause of chest pains or other symptoms of heart disease.  During this procedure, a large X-ray machine, called a CT scanner, takes detailed pictures of the heart and  the surrounding area after a dye (contrast material) has been injected into blood vessels in the area.  Ask your health care provider about changing or stopping your regular medicines before the procedure. This is especially important if you are taking diabetes medicines, blood thinners, or medicines to treat erectile dysfunction.  After the procedure, drink water or other fluids to wash (flush) the contrast material out of your body. This information is not intended to replace advice given to you by your health care provider. Make sure you discuss any questions you have with your health care provider. Document Released: 06/09/2008 Document Revised: 05/16/2016 Document Reviewed: 05/16/2016 Elsevier Interactive Patient Education  2017 [employer].

## 2017-12-26 ENCOUNTER — Telehealth: Payer: Self-pay

## 2017-12-26 NOTE — Telephone Encounter (Signed)
Left message to return call to discuss with patient that cardiac CTA has been denied and that Dr. Bettina Gavia would like for him to have a stress test instead.

## 2017-12-26 NOTE — Telephone Encounter (Signed)
-----   Message from Richardo Priest, MD sent at 12/25/2017  4:14 PM EDT ----- Regarding: RE: Cardiac CTA denial Lets set up a stress myoview ----- Message ----- From: Benancio Deeds Sent: 12/25/2017   2:54 PM To: Richardo Priest, MD Subject: Cardiac CTA denial                             Cardiac cta denied.  Christella Scheuermann stating high pretest probability for CAD. Meets criteria if only low pretest probability.   Would you like me to try to appeal?  Please advise.  Thank you,  Caryl Pina

## 2017-12-28 NOTE — Telephone Encounter (Signed)
Left message to return call 

## 2018-01-01 NOTE — Telephone Encounter (Signed)
Left message to return call. Sending message to home address advising patient to return call to schedule stress test and to reference this telephone note.

## 2018-01-12 ENCOUNTER — Ambulatory Visit
Admission: RE | Admit: 2018-01-12 | Discharge: 2018-01-12 | Disposition: A | Discharge status: Home / Self Care | Payer: Managed Care, Other (non HMO) | Source: Ambulatory Visit | Attending: Nurse Practitioner | Admitting: Nurse Practitioner

## 2018-01-12 ENCOUNTER — Other Ambulatory Visit: Payer: Self-pay | Admitting: Nurse Practitioner

## 2018-01-12 DIAGNOSIS — R109 Unspecified abdominal pain: Secondary | ICD-10-CM

## 2018-01-14 ENCOUNTER — Encounter (HOSPITAL_COMMUNITY): Payer: Self-pay | Admitting: Emergency Medicine

## 2018-01-14 ENCOUNTER — Emergency Department (HOSPITAL_COMMUNITY)
Admission: EM | Admit: 2018-01-14 | Discharge: 2018-01-14 | Disposition: A | Discharge status: Home / Self Care | Payer: Managed Care, Other (non HMO) | Attending: Emergency Medicine | Admitting: Emergency Medicine

## 2018-01-14 DIAGNOSIS — Z7984 Long term (current) use of oral hypoglycemic drugs: Secondary | ICD-10-CM | POA: Insufficient documentation

## 2018-01-14 DIAGNOSIS — Z79899 Other long term (current) drug therapy: Secondary | ICD-10-CM | POA: Diagnosis not present

## 2018-01-14 DIAGNOSIS — M545 Low back pain, unspecified: Secondary | ICD-10-CM

## 2018-01-14 DIAGNOSIS — Z7982 Long term (current) use of aspirin: Secondary | ICD-10-CM | POA: Diagnosis not present

## 2018-01-14 DIAGNOSIS — E119 Type 2 diabetes mellitus without complications: Secondary | ICD-10-CM | POA: Insufficient documentation

## 2018-01-14 DIAGNOSIS — I10 Essential (primary) hypertension: Secondary | ICD-10-CM | POA: Insufficient documentation

## 2018-01-14 LAB — URINALYSIS, ROUTINE W REFLEX MICROSCOPIC
Bilirubin Urine: NEGATIVE
Glucose, UA: NEGATIVE mg/dL
HGB URINE DIPSTICK: NEGATIVE
Ketones, ur: NEGATIVE mg/dL
LEUKOCYTES UA: NEGATIVE
NITRITE: NEGATIVE
Protein, ur: NEGATIVE mg/dL
SPECIFIC GRAVITY, URINE: 1.025 (ref 1.005–1.030)
pH: 5.5 (ref 5.0–8.0)

## 2018-01-14 MED ORDER — LIDOCAINE 5 % EX PTCH
1.0000 | MEDICATED_PATCH | CUTANEOUS | Status: DC
  Administered 2018-01-14: 1 via TRANSDERMAL
  Filled 2018-01-14: qty 1

## 2018-01-14 MED ORDER — METHOCARBAMOL 500 MG PO TABS
500.0000 mg | ORAL_TABLET | Freq: Once | ORAL | Status: AC
  Administered 2018-01-14: 500 mg via ORAL
  Filled 2018-01-14: qty 1

## 2018-01-14 MED ORDER — LIDOCAINE 5 % EX PTCH: 1.0000 | MEDICATED_PATCH | CUTANEOUS | Status: DC

## 2018-01-14 MED ORDER — KETOROLAC TROMETHAMINE 15 MG/ML IJ SOLN
30.0000 mg | Freq: Once | INTRAMUSCULAR | Status: AC
  Administered 2018-01-14: 30 mg via INTRAMUSCULAR
  Filled 2018-01-14: qty 2

## 2018-01-14 MED ORDER — LIDOCAINE 5 % EX PTCH: 1.0000 | MEDICATED_PATCH | CUTANEOUS | 0 refills | Status: AC

## 2018-01-14 NOTE — ED Provider Notes (Signed)
[facility]PART[facility] Provider Note   CSN: [id number] Arrival date & time: 01/14/18  1253     History   Chief Complaint Chief Complaint  Patient presents with  . Flank Pain    HPI Scott Simmons is a 52 y.o. male who presents with right low back pain.  Past medical history significant for history of acute renal failure, chronic low back pain, hypertension, diabetes.  The patient is on Suboxone chronically.  He states that on Friday he an acute onset of low back pain merrily on the right side.  He went to see his doctor at [facility] and they did a urine and blood work which was normal.  They sent him for a CT of the abdomen and pelvis which she also went and had done but does not know the results.  He was given a prescription for tramadol which she has been taking however today he felt like the pain was very severe therefore decided to come to the emergency department.  He denies fever or chills.  He reports nausea when the pain is severe but has not had any vomiting.  He denies any new urinary symptoms.  He has some urinary hesitancy however this is a chronic problem for him as well.  He denies radiation of pain into the legs, numbness, tingling, weakness.  He has been ambulatory without difficulty.  Denies any injury to the back or falls.  HPI  Past Medical History:  Diagnosis Date  . Acute renal disease   . Acute renal failure with tubular necrosis ([place]) 02/05/2016  . AKI (acute kidney injury) ([place]) 02/05/2016  . Arterial hypotension   . Chest pain 03/19/2014   Chest pain   . Diabetes ([place])   . Essential hypertension 03/19/2014   hypertension   . Hyperkalemia   . Hypertension   . Hypotension   . Lumbar disc disease    slipped disc  . Sleep apnea    cpap broke  . Slipped cervical disc   . SOB (shortness of breath)   . Tachycardia 07/18/2014  . Type 2 diabetes mellitus ([place]) 07/18/2014    Patient Active Problem List   Diagnosis Date Noted  . SOB  (shortness of breath)   . Acute renal disease   . AKI (acute kidney injury) ([place]) 02/05/2016  . Acute renal failure with tubular necrosis ([place]) 02/05/2016  . Hyperkalemia   . Arterial hypotension   . Tachycardia 07/18/2014  . Type 2 diabetes mellitus ([place]) 07/18/2014  . Essential hypertension 03/19/2014  . Chest pain 03/19/2014    Past Surgical History:  Procedure Laterality Date  . COLONOSCOPY WITH PROPOFOL N/A 11/09/2015   Procedure: COLONOSCOPY WITH PROPOFOL;  Surgeon: Garlan Fair, MD;  Location: [facility];  Service: Endoscopy;  Laterality: N/A;  . ESOPHAGOGASTRODUODENOSCOPY (EGD) WITH PROPOFOL N/A 11/09/2015   Procedure: ESOPHAGOGASTRODUODENOSCOPY (EGD) WITH PROPOFOL;  Surgeon: Garlan Fair, MD;  Location: [facility];  Service: Endoscopy;  Laterality: N/A;  . KNEE SURGERY Left    acl repair  . SHOULDER SURGERY Right    arthroscopic        Home Medications    Prior to Admission medications   Medication Sig Start Date End Date Taking? Authorizing Provider  amLODipine (NORVASC) 5 MG tablet Take 5 mg by mouth daily. 10/29/17   [provider]  aspirin EC 81 MG tablet Take 81 mg by mouth daily.    [provider]  busPIRone (BUSPAR) 10 MG tablet  Take 10 mg by mouth 2 (two) times daily. 11/21/17   [provider]  celecoxib (CELEBREX) 200 MG capsule Take 200 mg by mouth daily. 10/21/17   [provider]  gabapentin (NEURONTIN) 300 MG capsule Take 300 mg by mouth at bedtime. 10/13/15   [provider]  levothyroxine (SYNTHROID, LEVOTHROID) 150 MCG tablet Take 150 mcg by mouth daily before breakfast.    [provider]  lidocaine (LIDODERM) 5 % Place 1 patch onto the skin daily. Remove & Discard patch within 12 hours or as directed by MD 01/14/18   Recardo Evangelist, PA-C  metoprolol succinate (TOPROL-XL) 50 MG 24 hr tablet Take 1 tablet (50 mg total) by mouth daily. Take with or immediately following a meal. 11/30/17 02/28/18   Richardo Priest, MD  Multiple Vitamin (MULTIVITAMIN) capsule Take 1 capsule by mouth daily.    [provider]  nitroGLYCERIN (NITROSTAT) 0.4 MG SL tablet Place 1 tablet (0.4 mg total) under the tongue every 5 (five) minutes as needed. 11/30/17 02/28/18  Richardo Priest, MD  Omega-3 Fatty Acids (FISH OIL PO) Take 1 capsule by mouth 3 (three) times daily.     [provider]  pioglitazone-metformin (ACTOPLUS MET) 15-850 MG tablet Take 1 tablet by mouth 2 (two) times daily with a meal.  10/02/17   [provider]  pravastatin (PRAVACHOL) 20 MG tablet Take 1 tablet (20 mg total) by mouth every evening. 11/30/17 02/28/18  Richardo Priest, MD  SUBOXONE 8-2 MG FILM Take 1 Film by mouth 2 (two) times daily.  03/03/14   [provider]  testosterone cypionate (DEPOTESTOTERONE CYPIONATE) 200 MG/ML injection Inject 200 mg into the muscle every 7 (seven) days.     [provider]  tiZANidine (ZANAFLEX) 4 MG capsule Take 4 mg by mouth 3 (three) times daily as needed for muscle spasms.  12/23/13   [provider]  TRULICITY 1.5 VO/5.3GU SOPN Inject 1.5 mg as directed once a week. 10/21/17   [provider]  venlafaxine XR (EFFEXOR-XR) 150 MG 24 hr capsule Take 150 mg by mouth at bedtime.  10/29/17   [provider]  VIAGRA 100 MG tablet Take 100 mg by mouth daily as needed for erectile dysfunction.  12/27/13   [provider]    Family History Family History  Problem Relation Age of Onset  . Stroke Paternal Grandmother   . Diabetes Paternal Grandmother   . Lung cancer Paternal Grandfather   . Stroke Paternal Grandfather   . Diabetes Paternal Grandfather   . Heart murmur Paternal Grandfather   . Thyroid cancer Brother   . Heart attack Brother   . Mental retardation Other        half-brother x2  . Atrial fibrillation Father     Social History Social History   Tobacco Use  . Smoking status: Never Smoker  . Smokeless tobacco:  Never Used  Substance Use Topics  . Alcohol use: No  . Drug use: No     Allergies   Patient has no known allergies.   Review of Systems Review of Systems  Constitutional: Negative for chills and fever.  Gastrointestinal: Positive for nausea. Negative for abdominal pain and vomiting.  Genitourinary: Positive for flank pain. Negative for dysuria, frequency, hematuria and testicular pain.  Musculoskeletal: Positive for back pain and myalgias.  Neurological: Negative for weakness and numbness.  All other systems reviewed and are negative.    Physical Exam Updated Vital Signs BP (!) 152/96 (  BP Location: Right Arm)   Pulse 84   Temp 98.3 F (36.8 C) (Oral)   Resp 20   SpO2 100%   Physical Exam  Constitutional: He is oriented to person, place, and time. He appears well-developed and well-nourished. No distress.  Calm and cooperative  HENT:  Head: Normocephalic and atraumatic.  Eyes: Pupils are equal, round, and reactive to light. Conjunctivae are normal. Right eye exhibits no discharge. Left eye exhibits no discharge. No scleral icterus.  Neck: Normal range of motion.  Cardiovascular: Normal rate and regular rhythm.  Pulmonary/Chest: Effort normal and breath sounds normal. No respiratory distress.  Abdominal: Soft. Bowel sounds are normal. He exhibits no distension and no mass. There is tenderness (Right CVA tenderness). There is no rebound and no guarding. No hernia.  Musculoskeletal:  Back: Inspection: No masses, deformity, or rash Palpation: No midline spinal tenderness. Right upper lumbar paraspinal muscle tenderness. Strength: 5/5 in lower extremities and normal plantar and dorsiflexion Sensation: Intact sensation with light touch in lower extremities bilaterally SLR: Negative seated straight leg raise Gait: Normal gait   Neurological: He is alert and oriented to person, place, and time.  Skin: Skin is warm and dry.  Psychiatric: He has a normal mood and affect. His  behavior is normal.  Nursing note and vitals reviewed.    ED Treatments / Results  Labs (all labs ordered are listed, but only abnormal results are displayed) Labs Reviewed  URINALYSIS, ROUTINE W REFLEX MICROSCOPIC - Abnormal; Notable for the following components:      Result Value   Color, Urine YELLOW (*)    APPearance CLEAR (*)    All other components within normal limits    EKG None  Radiology No results found.  Procedures Procedures (including critical care time)  Medications Ordered in ED Medications  lidocaine (LIDODERM) 5 % 1 patch (has no administration in time range)  ketorolac (TORADOL) 15 MG/ML injection 30 mg (30 mg Intramuscular Given 01/14/18 1438)  methocarbamol (ROBAXIN) tablet 500 mg (500 mg Oral Given 01/14/18 1438)     Initial Impression / Assessment and Plan / ED Course  I have reviewed the triage vital signs and the nursing notes.  Pertinent labs & imaging results that were available during my care of the patient were reviewed by me and considered in my medical decision making (see chart for details).  52 year old male presents with acute right low back pain without obvious etiology.  He has been seen by his primary physician and had blood work and a CT scan as an outpatient.  He is mildly hypertensive but otherwise vital signs are normal.  He has mild tenderness over the right side of the back on exam with right CVA tenderness.  His repeat urine here is normal appearing.  CT scan was reviewed from a couple of days ago which shows bilateral mild perinephric stranding.  He has no urinary symptoms, a normal UA here, and reportedly has normal blood work from 2 days ago as well.  We will treat his pain is musculoskeletal with Toradol, Robaxin and lidocaine patch.    On recheck he reports feeling a little bit better.  He already has pain medicines, anti-inflammatories, and a muscle relaxer at home.  He was given a prescription for lidocaine patch and advised to  follow-up with his primary care doctor.  Final Clinical Impressions(s) / ED Diagnoses   Final diagnoses:  Acute right-sided low back pain without sciatica    ED Discharge Orders  Ordered    lidocaine (LIDODERM) 5 %  Every 24 hours     01/14/18 1504       Recardo Evangelist, PA-C 01/14/18 1517    Valarie Merino, MD 01/14/18 [phone]

## 2018-01-14 NOTE — ED Notes (Signed)
Pt has taken Tramadol today for pain, approx 3 hours ago.  States to take 3 times a day.  Will wait for patient to be seen to provide more pain medication.

## 2018-01-14 NOTE — Discharge Instructions (Signed)
Please continue Tramadol, Celebrex, Tizanidine  Try Lidocaine patch. It's over the counter if your insurance doesn't cover it Please follow up with your doctor

## 2018-01-14 NOTE — ED Triage Notes (Signed)
Pt presents to ED for assessment of right flank pain starting 3 days ago.  States for 2 weeks prior he has been having lower back/hip pain.  Denies changes in urination.  Had blood, urine and CT scan done.  CT scan states streaking from previous acute kidney injury in past, but did not see stones.  Patient c/o nausea.  Pt c/o increased sweating, but denies known fever or chills

## 2018-07-08 IMAGING — CT CT ABD-PELV W/O CM
2 of 4 series · 16 of 46 positions shown, 18 images · non-contrast
Comparison: Renal ultrasound 02/05/2016

CLINICAL DATA: Right flank pain

EXAM:
CT ABDOMEN AND PELVIS WITHOUT CONTRAST
TECHNIQUE: Multidetector CT imaging of the abdomen and pelvis was performed
following the standard protocol without IV contrast.

[Series 2: renal stone 5.00 br40 s3 ax · axial · 0.81mm/px · z∈[+1124,+1614]mm · 13 of 108 slices shown, 15 images]
[im 5/108  soft-tissue]
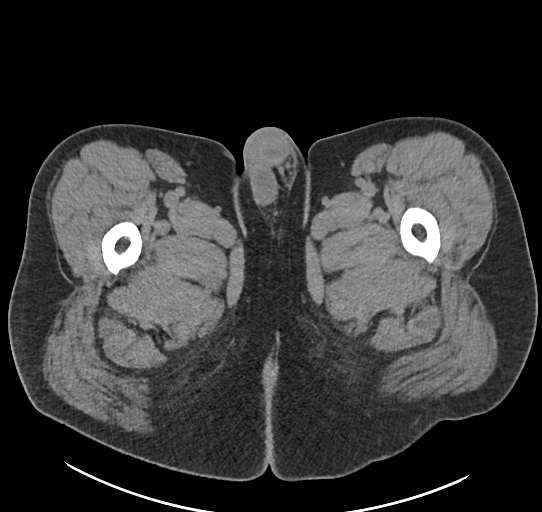
[im 5/108  bone]
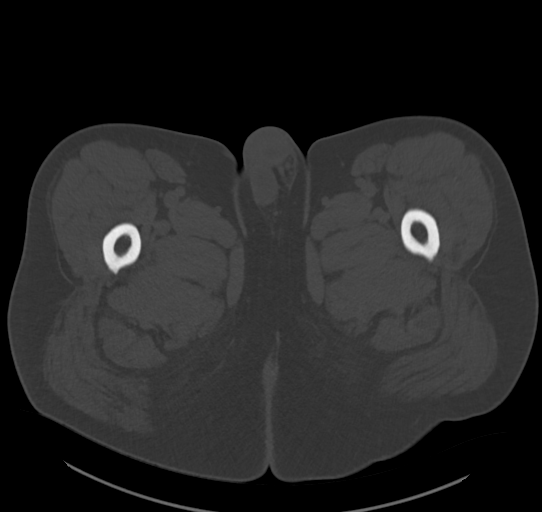
[im 13/108  soft-tissue]
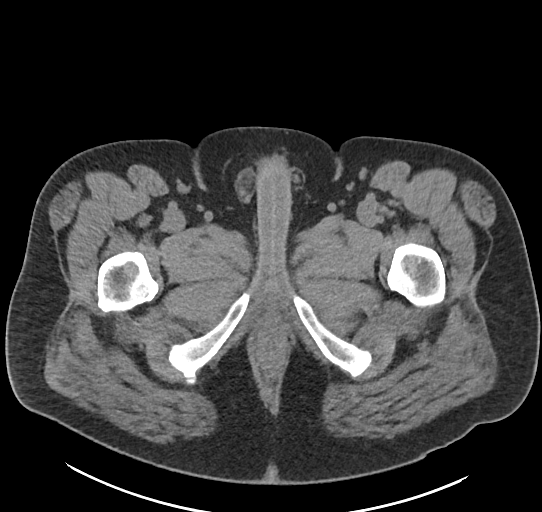
[im 22/108  soft-tissue]
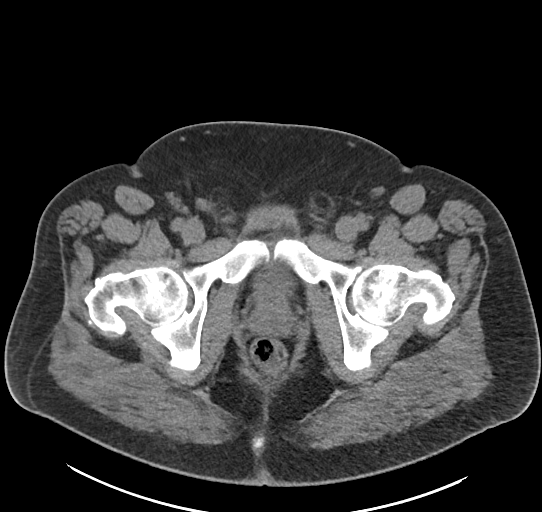
[im 30/108  soft-tissue]
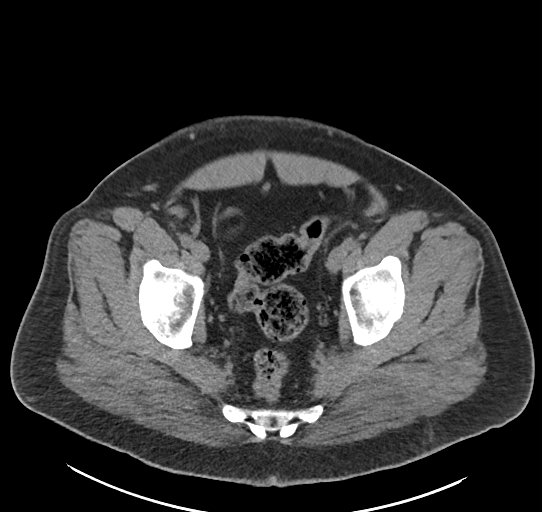
[im 39/108  soft-tissue]
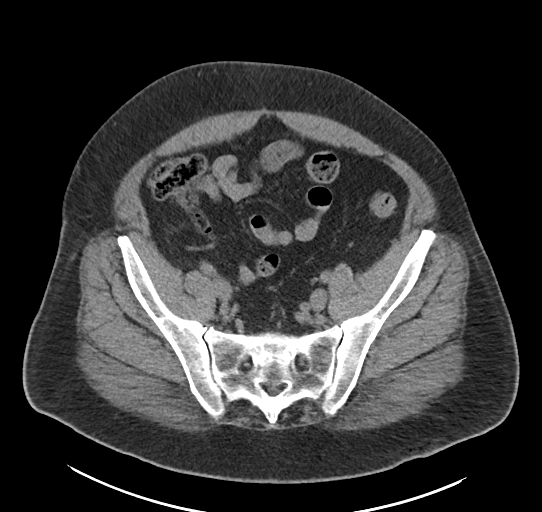
[im 48/108  soft-tissue]
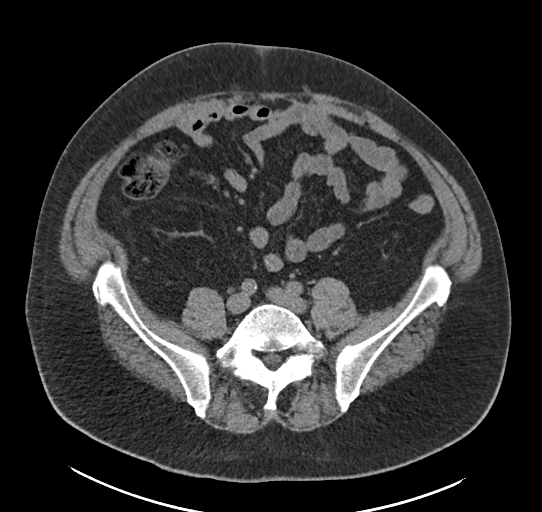
[im 56/108  soft-tissue]
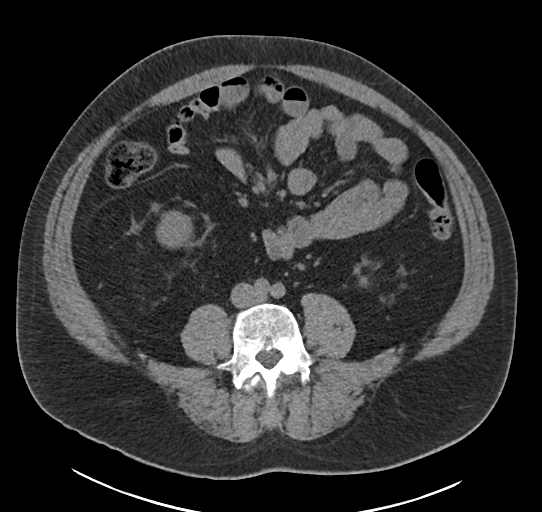
[im 60/108  soft-tissue]
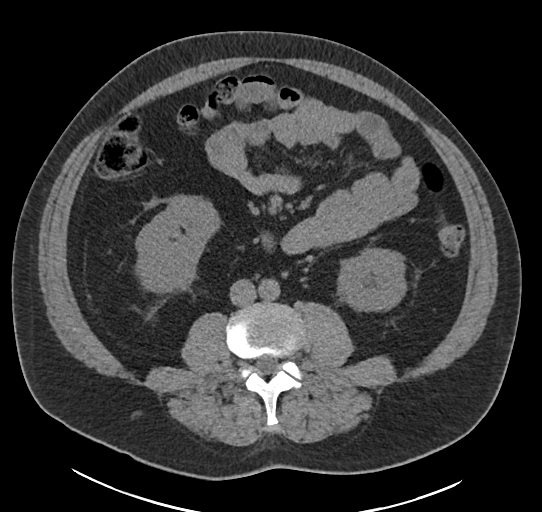
[im 69/108  soft-tissue]
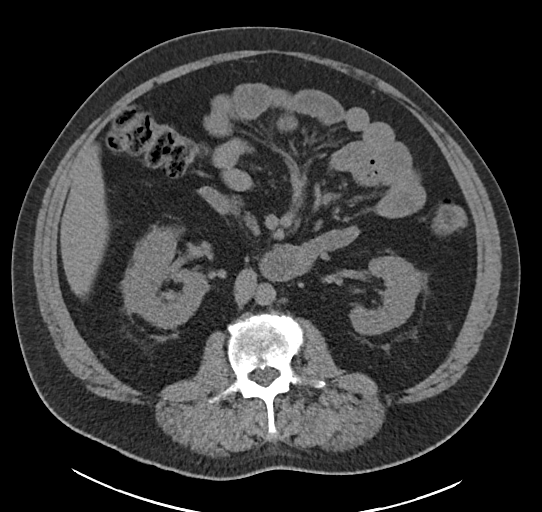
[im 69/108  bone]
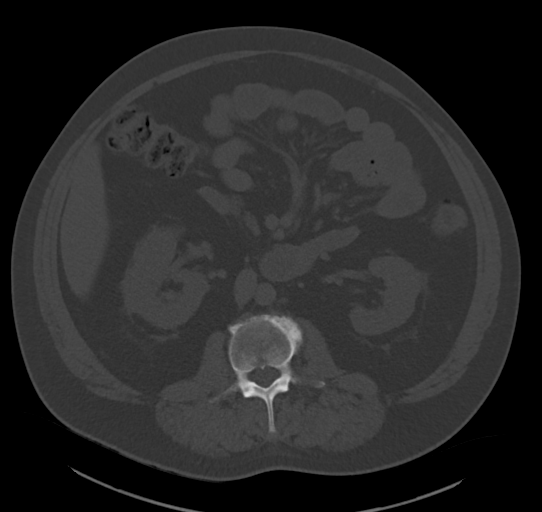
[im 78/108  soft-tissue]
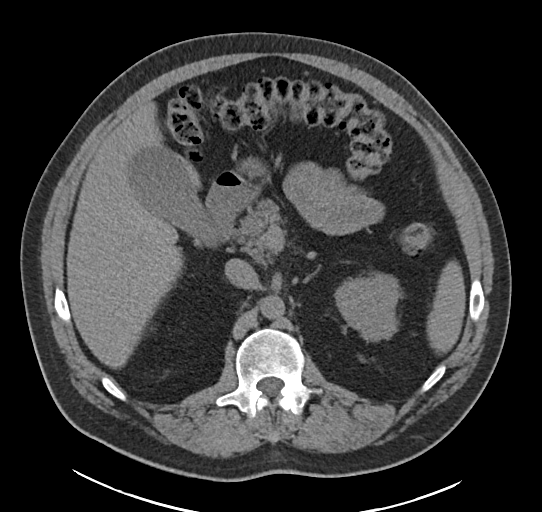
[im 86/108  soft-tissue]
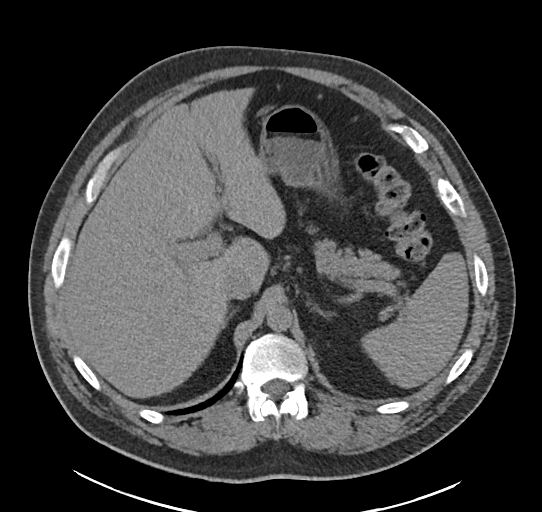
[im 95/108  soft-tissue]
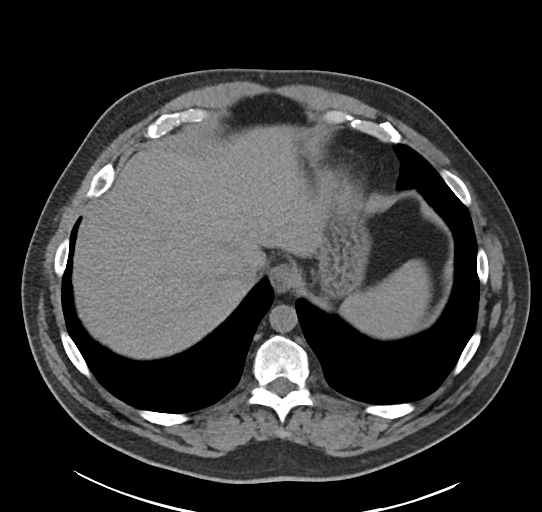
[im 103/108  soft-tissue]
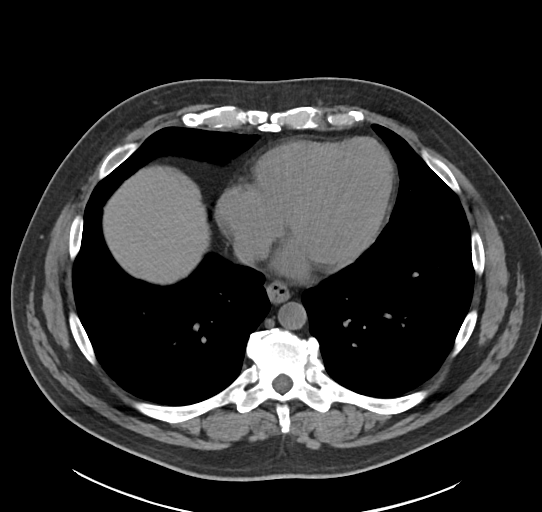

[Series 6: renal stone 2.00 br40 s3 cor · coronal · 0.86mm/px · 3 of 208 slices shown]
[im 70/208  soft-tissue]
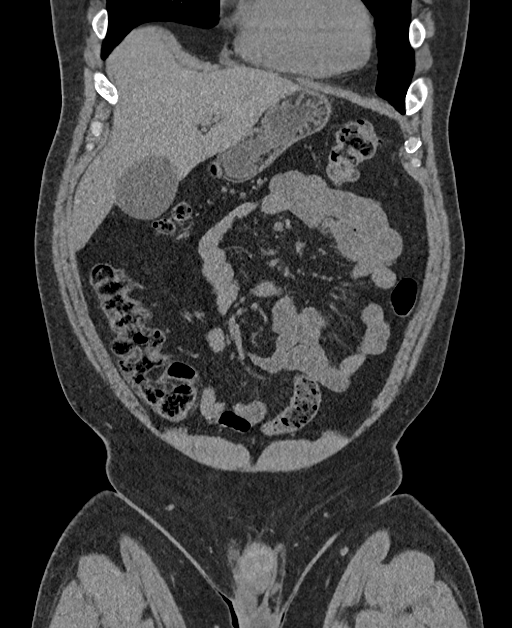
[im 93/208  soft-tissue]
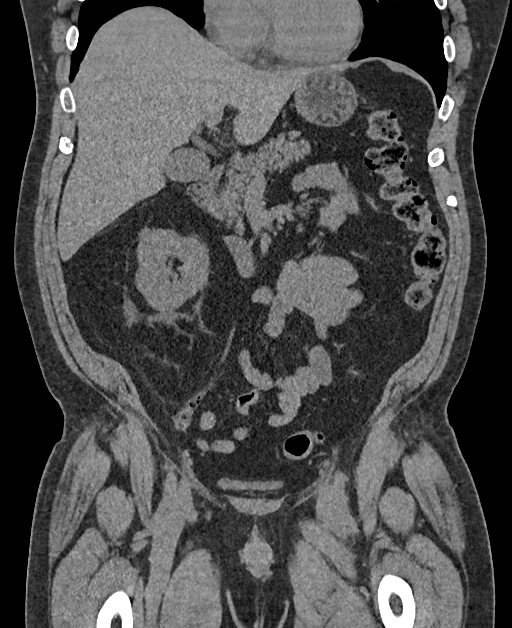
[im 116/208  soft-tissue]
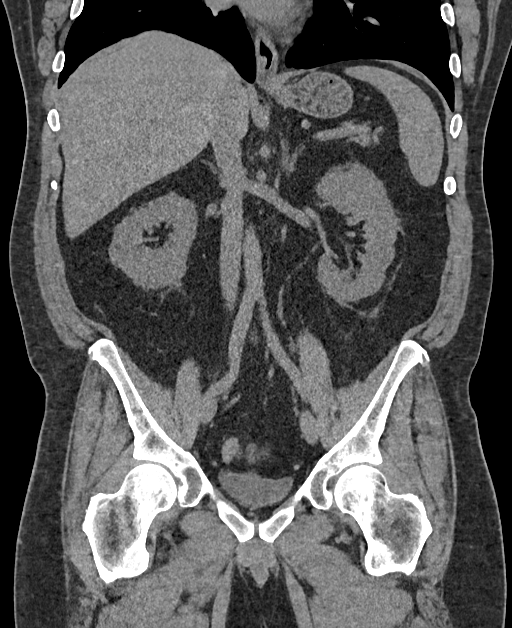

[16 of 46 positions shown; findings below may reference images not displayed]

FINDINGS: Lower chest: Lung bases are clear. No effusions. Heart is normal
size.

Hepatobiliary: No focal hepatic abnormality. Gallbladder
unremarkable.

Pancreas: No focal abnormality or ductal dilatation.

Spleen: No focal abnormality.  Normal size.

Adrenals/Urinary Tract: Mild bilateral perinephric stranding. No
renal or ureteral stones. No hydronephrosis. Urinary bladder is
decompressed. Adrenal glands unremarkable.

Stomach/Bowel: Normal appendix. Stomach, large and small bowel
grossly unremarkable.

Vascular/Lymphatic: No evidence of aneurysm or adenopathy.

Reproductive: No visible focal abnormality.

Other: No free fluid or free air.

Musculoskeletal: No acute bony abnormality.
IMPRESSION: Mild bilateral perinephric stranding, likely related to prior
insult. No renal or ureteral stones. No hydronephrosis.

No acute findings in the abdomen or pelvis.

## 2018-08-26 ENCOUNTER — Other Ambulatory Visit: Payer: Self-pay | Admitting: Cardiology

## 2018-10-12 ENCOUNTER — Other Ambulatory Visit: Payer: Self-pay | Admitting: Cardiology

## 2019-04-04 ENCOUNTER — Other Ambulatory Visit: Payer: Self-pay | Admitting: Cardiology

## 2019-04-29 ENCOUNTER — Other Ambulatory Visit: Payer: Self-pay | Admitting: Cardiology

## 2019-04-30 NOTE — Telephone Encounter (Signed)
Metoprolol 15 day refill sent. Further refills should be through PCP.

## 2019-05-12 ENCOUNTER — Other Ambulatory Visit: Payer: Self-pay | Admitting: Cardiology

## 2019-06-15 ENCOUNTER — Other Ambulatory Visit: Payer: Self-pay | Admitting: Cardiology

## 2021-10-08 ENCOUNTER — Ambulatory Visit
Admission: RE | Admit: 2021-10-08 | Discharge: 2021-10-08 | Disposition: A | Discharge status: Home / Self Care | Payer: Managed Care, Other (non HMO) | Source: Ambulatory Visit | Attending: Internal Medicine | Admitting: Internal Medicine

## 2021-10-08 ENCOUNTER — Other Ambulatory Visit: Payer: Self-pay | Admitting: Internal Medicine

## 2021-10-08 DIAGNOSIS — R1031 Right lower quadrant pain: Secondary | ICD-10-CM

## 2022-03-09 ENCOUNTER — Other Ambulatory Visit: Payer: Self-pay

## 2022-03-09 ENCOUNTER — Encounter (HOSPITAL_COMMUNITY): Payer: Self-pay | Admitting: Emergency Medicine

## 2022-03-09 ENCOUNTER — Emergency Department (HOSPITAL_COMMUNITY)
Admission: EM | Admit: 2022-03-09 | Discharge: 2022-03-09 | Disposition: A | Discharge status: Home / Self Care | Payer: Managed Care, Other (non HMO) | Attending: Emergency Medicine | Admitting: Emergency Medicine

## 2022-03-09 DIAGNOSIS — E119 Type 2 diabetes mellitus without complications: Secondary | ICD-10-CM | POA: Diagnosis not present

## 2022-03-09 DIAGNOSIS — S0501XA Injury of conjunctiva and corneal abrasion without foreign body, right eye, initial encounter: Secondary | ICD-10-CM | POA: Insufficient documentation

## 2022-03-09 DIAGNOSIS — X58XXXA Exposure to other specified factors, initial encounter: Secondary | ICD-10-CM | POA: Insufficient documentation

## 2022-03-09 DIAGNOSIS — I10 Essential (primary) hypertension: Secondary | ICD-10-CM | POA: Diagnosis not present

## 2022-03-09 DIAGNOSIS — H5711 Ocular pain, right eye: Secondary | ICD-10-CM | POA: Diagnosis present

## 2022-03-09 MED ORDER — TETRACAINE HCL 0.5 % OP SOLN
2.0000 [drp] | Freq: Once | OPHTHALMIC | Status: AC
  Administered 2022-03-09: 2 [drp] via OPHTHALMIC
  Filled 2022-03-09: qty 4

## 2022-03-09 MED ORDER — OFLOXACIN 0.3 % OP SOLN: 1.0000 [drp] | Freq: Four times a day (QID) | OPHTHALMIC | 0 refills | Status: AC

## 2022-03-09 MED ORDER — FLUORESCEIN SODIUM 1 MG OP STRP
1.0000 | ORAL_STRIP | Freq: Once | OPHTHALMIC | Status: AC
  Administered 2022-03-09: 1 via OPHTHALMIC
  Filled 2022-03-09: qty 1

## 2022-03-09 NOTE — Discharge Instructions (Signed)
You were evaluated in the Emergency Department and after careful evaluation, we did not find any emergent condition requiring admission or further testing in the hospital.  Your exam/testing today was overall reassuring.  Symptoms seem to be due to an abrasion to the surface of the eye.  Take the prescribed eyedrops as directed, recommend close follow-up with an eye specialist.  Please return to the Emergency Department if you experience any worsening of your condition.  Thank you for allowing [place] to be a part of your care.

## 2022-03-09 NOTE — ED Triage Notes (Signed)
Pt reports his right eye began to hurt tonight at work.  He tried changing his contacts however no relief, now is just wearing glasses.  The right eye is blurry "because my eye will not stop watering."  The initial pain started at home after he put his contacts in but got worse at work.

## 2022-03-09 NOTE — ED Provider Notes (Signed)
[facility] Emergency Department Provider Note MRN:  [id number]  Arrival date & time: 03/09/22     Chief Complaint   Eye Pain   History of Present Illness   Scott Simmons is a 56 y.o. year-old male with no pertinent past medical history presenting to the ED with chief complaint of eye pain.  Worsening pain to the right eye today since he changed his contacts.  When he put the first contact and he felt like something was uncomfortable, felt like there was a tear in the contact.  Worse and worse throughout the day, now very uncomfortable, foreign body sensation.  Vision is a bit blurry from tearing but denies vision loss.  No other complaints.  No trauma or other foreign bodies.  Review of Systems  A thorough review of systems was obtained and all systems are negative except as noted in the HPI and PMH.   Patient's Health History    Past Medical History:  Diagnosis Date   Acute renal disease    Acute renal failure with tubular necrosis ([place]) 02/05/2016   AKI (acute kidney injury) ([place]) 02/05/2016   Arterial hypotension    Chest pain 03/19/2014   Chest pain    Diabetes ([place])    Essential hypertension 03/19/2014   hypertension    Hyperkalemia    Hypertension    Hypotension    Lumbar disc disease    slipped disc   Sleep apnea    cpap broke   Slipped cervical disc    SOB (shortness of breath)    Tachycardia 07/18/2014   Type 2 diabetes mellitus ([place]) 07/18/2014    Past Surgical History:  Procedure Laterality Date   COLONOSCOPY WITH PROPOFOL N/A 11/09/2015   Procedure: COLONOSCOPY WITH PROPOFOL;  Surgeon: Garlan Fair, MD;  Location: [facility];  Service: Endoscopy;  Laterality: N/A;   ESOPHAGOGASTRODUODENOSCOPY (EGD) WITH PROPOFOL N/A 11/09/2015   Procedure: ESOPHAGOGASTRODUODENOSCOPY (EGD) WITH PROPOFOL;  Surgeon: Garlan Fair, MD;  Location: [facility];  Service: Endoscopy;  Laterality: N/A;   KNEE SURGERY Left    acl repair   SHOULDER SURGERY  Right    arthroscopic    Family History  Problem Relation Age of Onset   Stroke Paternal Grandmother    Diabetes Paternal Grandmother    Lung cancer Paternal Grandfather    Stroke Paternal Grandfather    Diabetes Paternal Grandfather    Heart murmur Paternal Grandfather    Thyroid cancer Brother    Heart attack Brother    Mental retardation Other        half-brother x2   Atrial fibrillation Father     Social History   Socioeconomic History   Marital status: Married    Spouse name: Not on file   Number of children: 3   Years of education: 12   Highest education level: Not on file  Occupational History   Occupation: Copywriter, advertising: [employer]  Tobacco Use   Smoking status: Never   Smokeless tobacco: Never  Vaping Use   Vaping Use: Never used  Substance and Sexual Activity   Alcohol use: No   Drug use: No   Sexual activity: Not on file  Other Topics Concern   Not on file  Social History Narrative   Not on file   Social Determinants of Health   Financial Resource Strain: Not on file  Food Insecurity: Not on file  Transportation Needs: Not on file  Physical Activity: Not  on file  Stress: Not on file  Social Connections: Not on file  Intimate Partner Violence: Not on file     Physical Exam   Vitals:   03/09/22 0459  BP: (!) 148/115  Pulse: (!) 111  Resp: 16  Temp: 99.2 F (37.3 C)  SpO2: 97%    CONSTITUTIONAL: Well-appearing, NAD NEURO/PSYCH:  Alert and oriented x 3, no focal deficits EYES:  eyes equal and reactive, normal extraocular movements, right eye is with conjunctival erythema ENT/NECK:  no LAD, no JVD CARDIO: Regular rate, well-perfused, normal S1 and S2 PULM:  CTAB no wheezing or rhonchi GI/GU:  non-distended, non-tender MSK/SPINE:  No gross deformities, no edema SKIN:  no rash, atraumatic   *Additional and/or pertinent findings included in MDM below  Diagnostic and Interventional Summary    EKG  Interpretation  Date/Time:    Ventricular Rate:    PR Interval:    QRS Duration:   QT Interval:    QTC Calculation:   R Axis:     Text Interpretation:         Labs Reviewed - No data to display  No orders to display    Medications  tetracaine (PONTOCAINE) 0.5 % ophthalmic solution 2 drop (has no administration in time range)  fluorescein ophthalmic strip 1 strip (has no administration in time range)     Procedures  /  Critical Care Procedures  ED Course and Medical Decision Making  Initial Impression and Ddx Suspect abrasion.  Pain is resolved after tetracaine.  Normal visual acuity.  And so doubt acute angle-closure glaucoma.  There is conjunctival erythema and with fluorescein and [place] lamp there is a linear abrasion to the conjunctivo at the 6 o'clock position.  Appropriate for discharge with return precautions.  Past medical/surgical history that increases complexity of ED encounter: None  Interpretation of Diagnostics Not applicable  Patient Reassessment and Ultimate Disposition/Management     Discharge  Patient management required discussion with the following services or consulting groups:  None  Complexity of Problems Addressed Acute illness or injury that poses threat of life of bodily function  Additional Data Reviewed and Analyzed Further history obtained from: None  Additional Factors Impacting ED Encounter Risk Prescriptions  Barth Kirks. [place] Small, [place] [email or web address]  Final Clinical Impressions(s) / ED Diagnoses     ICD-10-CM   1. Abrasion of right conjunctiva, initial encounter  S05.[id number]       ED Discharge Orders          Ordered    ofloxacin (OCUFLOX) 0.3 % ophthalmic solution  4 times daily        03/09/22 0555             Discharge Instructions Discussed with and Provided to Patient:    Discharge Instructions      You were evaluated in the Emergency Department and  after careful evaluation, we did not find any emergent condition requiring admission or further testing in the hospital.  Your exam/testing today was overall reassuring.  Symptoms seem to be due to an abrasion to the surface of the eye.  Take the prescribed eyedrops as directed, recommend close follow-up with an eye specialist.  Please return to the Emergency Department if you experience any worsening of your condition.  Thank you for allowing [place] to be a part of your care.       Maudie Flakes, MD 03/09/22 [phone]

## 2022-04-03 IMAGING — CT CT ABD-PELV W/O CM
2 of 4 series · 13 of 46 positions shown, 15 images · non-contrast
Comparison: CT January 12, 2018

CLINICAL DATA: Right lower quadrant abdominal pain.



[Series 2: routine abdomen pelvis without 5.00 br40 s3 axial · axial · non-contrast · 0.66mm/px · z∈[+1089,+1529]mm · 10 of 108 slices shown, 12 images]
[im 10/108  soft-tissue]
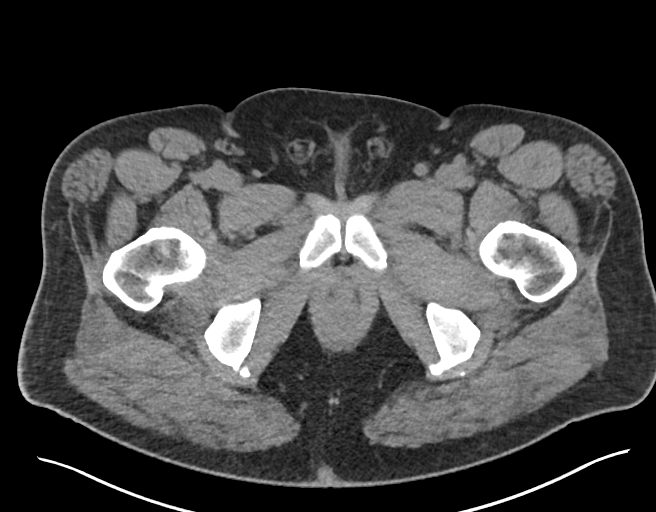
[im 10/108  bone]
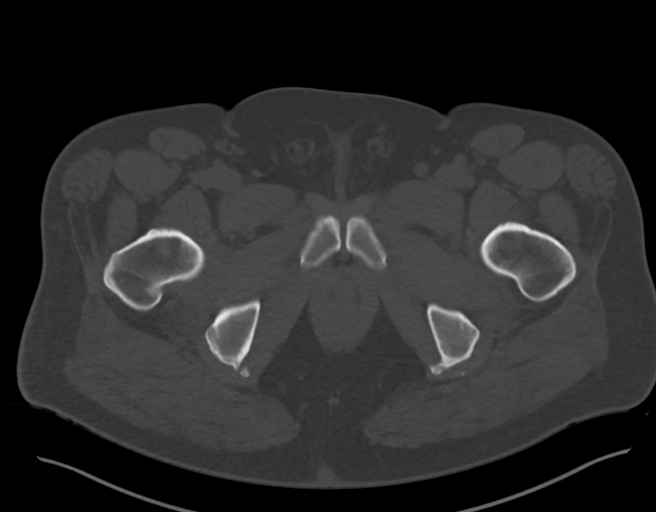
[im 19/108  soft-tissue]
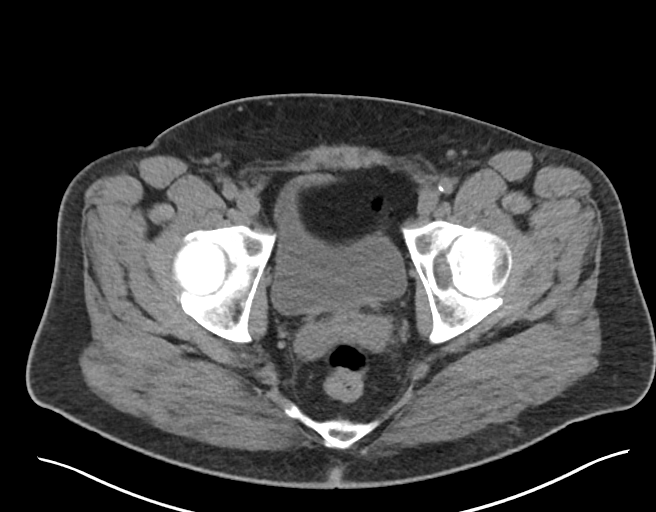
[im 28/108  soft-tissue]
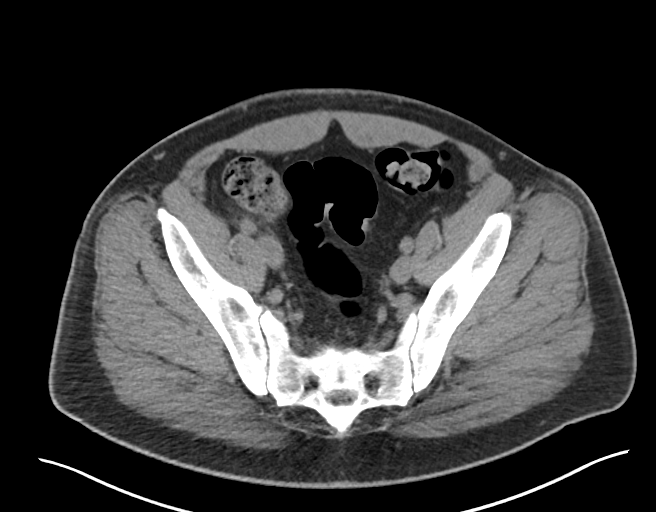
[im 38/108  soft-tissue]
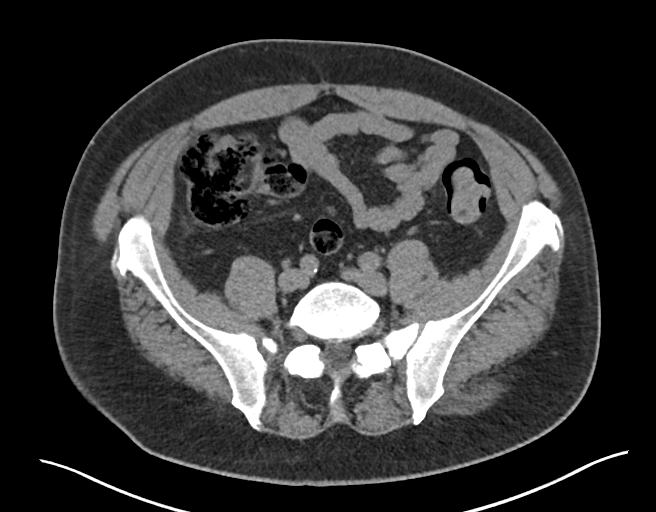
[im 47/108  soft-tissue]
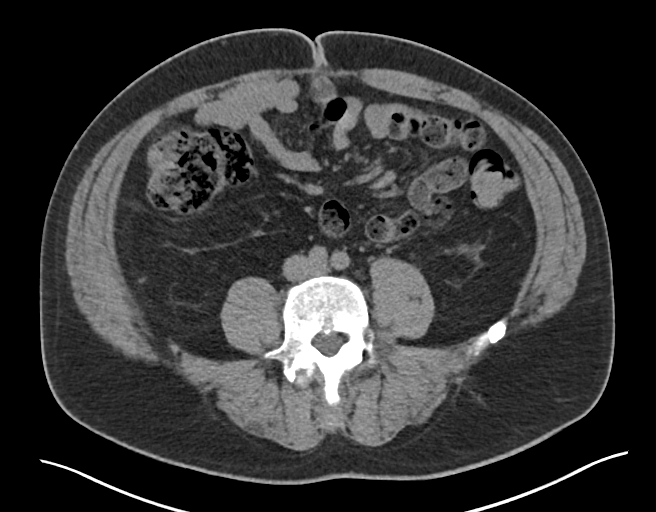
[im 61/108  soft-tissue]
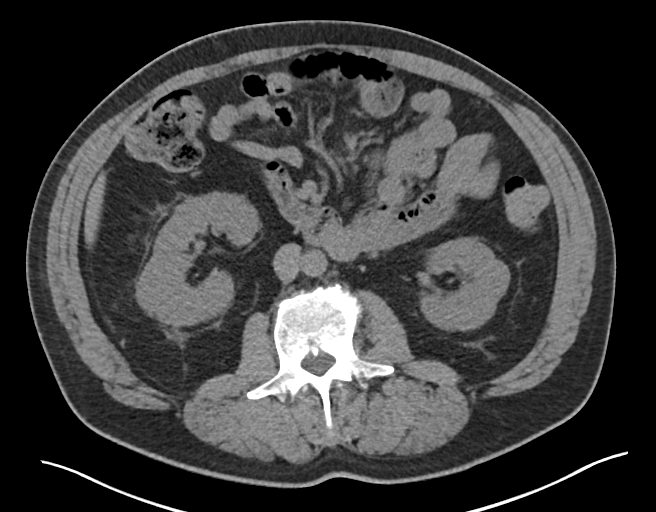
[im 70/108  soft-tissue]
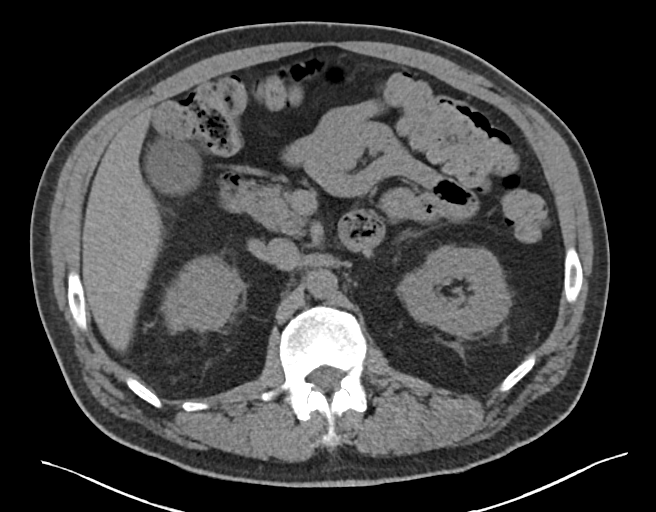
[im 80/108  soft-tissue]
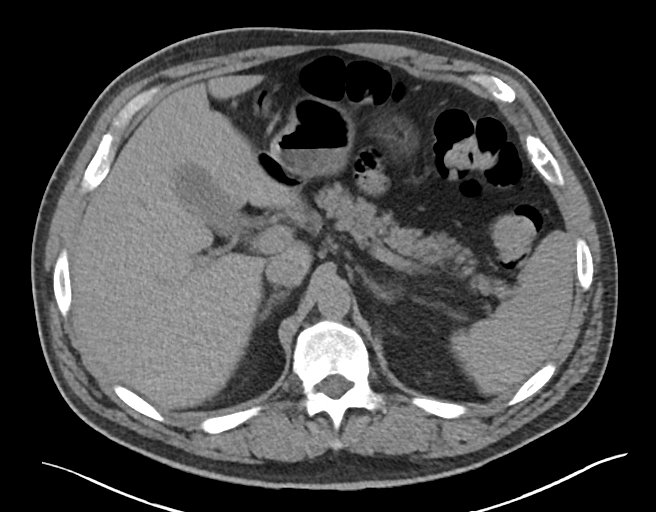
[im 89/108  soft-tissue]
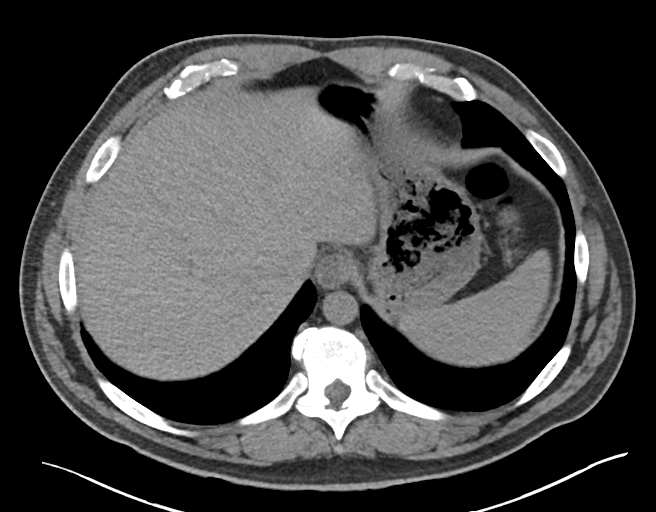
[im 89/108  bone]
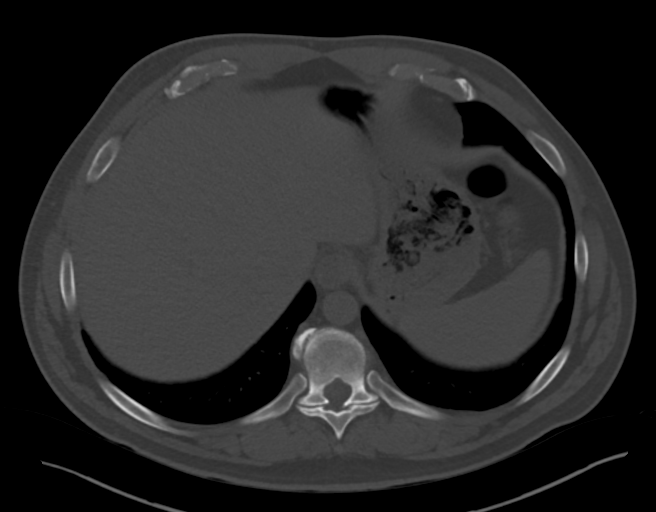
[im 98/108  soft-tissue]
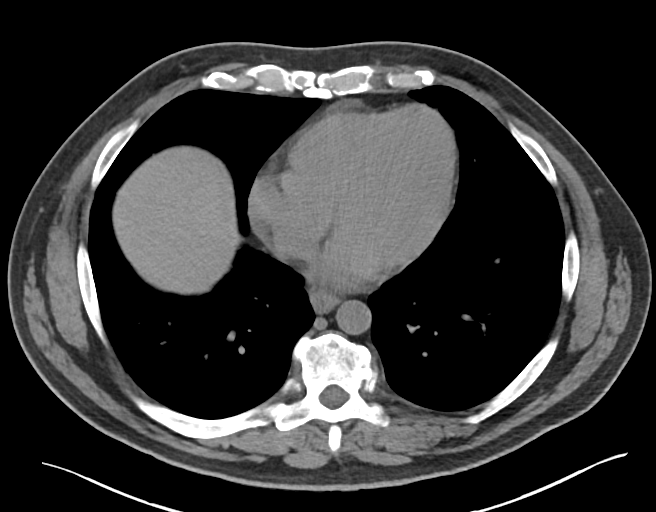

[Series 4: routine abdomen pelvis without 2.00 br40 s3 cor · coronal · non-contrast · 0.85mm/px · 3 of 169 slices shown]
[im 57/169  soft-tissue]
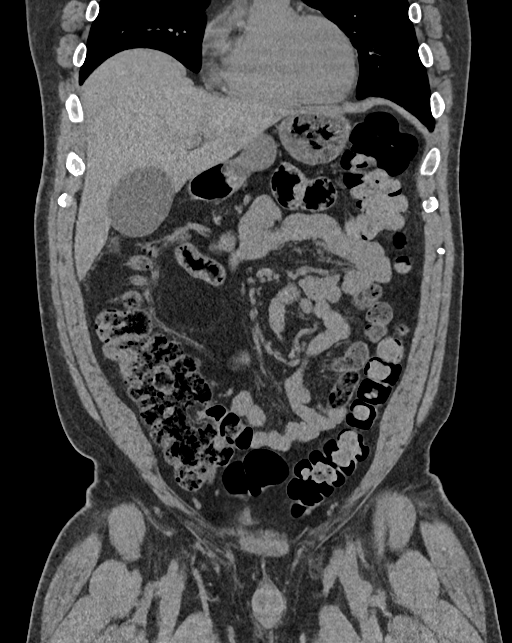
[im 75/169  soft-tissue]
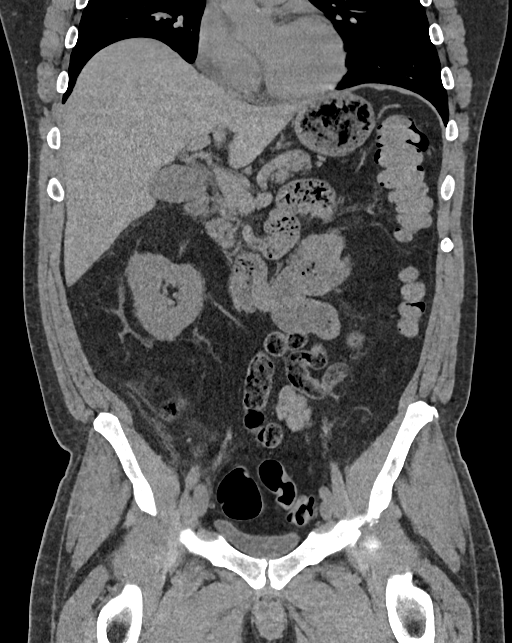
[im 94/169  soft-tissue]
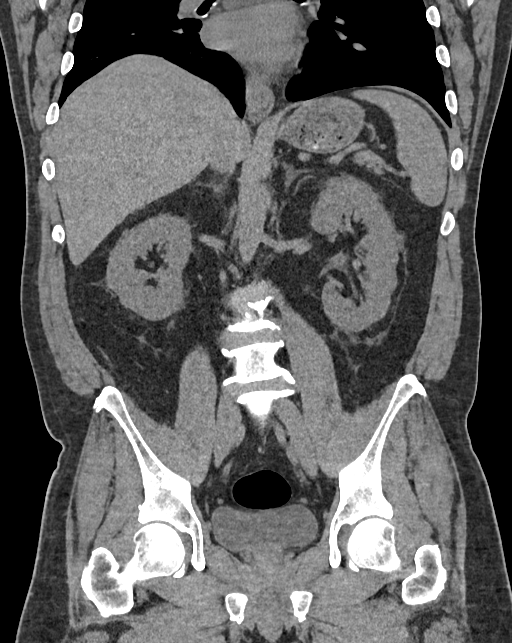

[13 of 46 positions shown; findings below may reference images not displayed]

FINDINGS: Lower chest: No acute abnormality.

Hepatobiliary: No suspicious hepatic lesion on this noncontrast
examination. Gallbladder is distended with no pericholecystic fluid,
possibly reflecting fasting state. Recommend correlation with right
upper quadrant tenderness to palpation. No biliary ductal dilation.

Pancreas: No pancreatic ductal dilation or evidence of acute
inflammation.

Spleen: No splenomegaly or focal splenic lesion.

Adrenals/Urinary Tract: Bilateral adrenal glands appear normal. No
hydronephrosis. No renal, ureteral or bladder calculi identified.
Urinary bladder is unremarkable for degree of distension.

Stomach/Bowel: No enteric contrast was administered. Stomach is
unremarkable for degree of distension. No pathologic dilation of
small or large bowel. The appendix and terminal ileum appear normal.
No evidence of acute bowel inflammation.

Vascular/Lymphatic: Scattered aortic atherosclerosis without
aneurysmal dilation. No pathologically enlarged abdominal or pelvic
lymph nodes.

Reproductive: Prostate is unremarkable.

Other: No significant abdominopelvic free fluid. No
pneumoperitoneum.

Musculoskeletal: Multilevel degenerative changes spine. No acute
osseous abnormality.
IMPRESSION: 1. Distended gallbladder with no pericholecystic fluid, possibly
reflecting fasting state. Recommend correlation with right upper
quadrant tenderness to palpation.
2. Otherwise, no acute abnormality within the abdomen or pelvis.
Specifically, the appendix and terminal ileum appear normal.
3. Aortic Atherosclerosis ([id number]).

## 2023-12-04 ENCOUNTER — Encounter (HOSPITAL_COMMUNITY): Payer: Self-pay

## 2023-12-04 ENCOUNTER — Emergency Department (HOSPITAL_COMMUNITY)
Admission: EM | Admit: 2023-12-04 | Discharge: 2023-12-04 | Disposition: A | Discharge status: Home / Self Care | Attending: Emergency Medicine | Admitting: Emergency Medicine

## 2023-12-04 ENCOUNTER — Emergency Department (HOSPITAL_COMMUNITY)

## 2023-12-04 ENCOUNTER — Other Ambulatory Visit: Payer: Self-pay

## 2023-12-04 DIAGNOSIS — I10 Essential (primary) hypertension: Secondary | ICD-10-CM | POA: Diagnosis not present

## 2023-12-04 DIAGNOSIS — R0789 Other chest pain: Secondary | ICD-10-CM | POA: Insufficient documentation

## 2023-12-04 DIAGNOSIS — E119 Type 2 diabetes mellitus without complications: Secondary | ICD-10-CM | POA: Diagnosis not present

## 2023-12-04 DIAGNOSIS — Z7982 Long term (current) use of aspirin: Secondary | ICD-10-CM | POA: Insufficient documentation

## 2023-12-04 DIAGNOSIS — Z79899 Other long term (current) drug therapy: Secondary | ICD-10-CM | POA: Insufficient documentation

## 2023-12-04 LAB — BASIC METABOLIC PANEL WITH GFR
Anion gap: 6 (ref 5–15)
BUN: 13 mg/dL (ref 6–20)
CO2: 29 mmol/L (ref 22–32)
Calcium: 8.6 mg/dL — ABNORMAL LOW (ref 8.9–10.3)
Chloride: 100 mmol/L (ref 98–111)
Creatinine, Ser: 0.96 mg/dL (ref 0.61–1.24)
GFR, Estimated: >60 mL/min (ref >60)
Glucose, Bld: 198 mg/dL — ABNORMAL HIGH (ref 70–99)
Potassium: 4.2 mmol/L (ref 3.5–5.1)
Sodium: 135 mmol/L (ref 135–145)

## 2023-12-04 LAB — CBC
HCT: 50.9 % (ref 39.0–52.0)
Hemoglobin: 17.3 g/dL — ABNORMAL HIGH (ref 13.0–17.0)
MCH: 31.5 pg (ref 26.0–34.0)
MCHC: 34 g/dL (ref 30.0–36.0)
MCV: 92.5 fL (ref 80.0–100.0)
Platelets: 271 10*3/uL (ref 150–400)
RBC: 5.5 MIL/uL (ref 4.22–5.81)
RDW: 12.5 % (ref 11.5–15.5)
WBC: 12.7 10*3/uL — ABNORMAL HIGH (ref 4.0–10.5)
nRBC: 0 % (ref 0.0–0.2)

## 2023-12-04 LAB — D-DIMER, QUANTITATIVE: D-Dimer, Quant: <0.27 ug{FEU}/mL (ref 0.00–0.50)

## 2023-12-04 LAB — TROPONIN I (HIGH SENSITIVITY)
Troponin I (High Sensitivity): 7 ng/L (ref <18)
Troponin I (High Sensitivity): 6 ng/L (ref <18)

## 2023-12-04 MED ORDER — KETOROLAC TROMETHAMINE 30 MG/ML IJ SOLN
30.0000 mg | Freq: Once | INTRAMUSCULAR | Status: AC
  Administered 2023-12-04: 30 mg via INTRAVENOUS
  Filled 2023-12-04: qty 1

## 2023-12-04 MED ORDER — ALUM & MAG HYDROXIDE-SIMETH 200-200-20 MG/5ML PO SUSP
15.0000 mL | Freq: Once | ORAL | Status: AC
  Administered 2023-12-04: 15 mL via ORAL
  Filled 2023-12-04: qty 30

## 2023-12-04 MED ORDER — ASPIRIN 325 MG PO TABS: 325.0000 mg | ORAL_TABLET | Freq: Every day | ORAL | Status: DC

## 2023-12-04 NOTE — ED Provider Notes (Signed)
 Blood pressure 132/73, pulse 87, temperature 98.3 F (36.8 C), resp. rate 11, height 5\' 10"  (1.778 m), weight 111.1 kg, SpO2 100%.  Assuming care from Dr. Carylon Claude.  In short, Scott Simmons is a 58 y.o. male with a chief complaint of Chest Pain .  Refer to the original H&P for additional details.  The current plan of care is to follow up on troponin and reassess.  08:00 AM  Second troponin negative. Patient continues to have central chest discomfort without clear provoking factors. No exertional pain symptoms. No diaphoresis. No nausea/vomiting. Suspicion for cardiac related pain is lower with negative troponin x 2 but will follow response to pain meds here and reassess.   09:00 AM  Pain improved with medications here.  He is still having some discomfort.  We discussed observation admission given his ongoing pain symptoms but after shared decision-making conversation states that due to work factors he cannot stay at home.  I discussed that even though he has reassuring labs to this point his pain symptoms could still be cardiac and while I am okay with him going home with close cardiology follow-up/referral he should return to the emergency department immediately if his pain significantly worsens he develops diaphoresis/nausea, passing out, other sudden/severe symptoms.  Patient verbalized understanding.    Roberts Ching, MD 12/04/23 [phone]

## 2023-12-04 NOTE — ED Notes (Signed)
 Pt notes that his pain has mildly improved from 7 to 6/10. Still notes tightness in his chest and feels as if he is having a hard time catching his breath. He is asking about the plan to leave because he works 3rd shift. MD notified.

## 2023-12-04 NOTE — ED Provider Notes (Signed)
 [place] EMERGENCY DEPARTMENT AT [facility] Provider Note   CSN: [id number] Arrival date & time: 12/04/23  4098     History  Chief Complaint  Patient presents with   Chest Pain    Scott Simmons is a 58 y.o. male.  HPI     This is a 58 year old male with a history of hypertension and hyperlipidemia who presents with chest pain.  Patient reports over the last several weeks he has had worsening chest pain.  He states it often is better in the morning and worsens throughout the day.  It is not necessarily exertional in nature.  He describes it as pressure.  It is nonradiating.  No shortness of breath.  He is a non-smoker.  No recent fevers or cough.  Home Medications Prior to Admission medications   Medication Sig Start Date End Date Taking? Authorizing Provider  amLODipine (NORVASC) 5 MG tablet Take 5 mg by mouth daily. 10/29/17   [provider]  aspirin EC 81 MG tablet Take 81 mg by mouth daily.    [provider]  busPIRone (BUSPAR) 10 MG tablet Take 10 mg by mouth 2 (two) times daily. 11/21/17   [provider]  celecoxib (CELEBREX) 200 MG capsule Take 200 mg by mouth daily. 10/21/17   [provider]  gabapentin (NEURONTIN) 300 MG capsule Take 300 mg by mouth at bedtime. 10/13/15   [provider]  levothyroxine  (SYNTHROID , LEVOTHROID) 150 MCG tablet Take 150 mcg by mouth daily before breakfast.    [provider]  lidocaine  (LIDODERM ) 5 % Place 1 patch onto the skin daily. Remove & Discard patch within 12 hours or as directed by MD 01/14/18   Maryanna Smart, PA-C  metoprolol  succinate (TOPROL -XL) 50 MG 24 hr tablet TAKE 1 TABLET (50 MG TOTAL) BY MOUTH DAILY. PLEASE OBTAIN FUTURE REFILLS FROM PCP 04/30/19   Hassan Links, MD  Multiple Vitamin (MULTIVITAMIN) capsule Take 1 capsule by mouth daily.    [provider]  nitroGLYCERIN  (NITROSTAT ) 0.4 MG SL tablet Place 1 tablet (0.4 mg total) under the tongue  every 5 (five) minutes as needed. 11/30/17 02/28/18  Hassan Links, MD  Omega-3 Fatty Acids (FISH OIL PO) Take 1 capsule by mouth 3 (three) times daily.     [provider]  pioglitazone-metformin (ACTOPLUS MET) 15-850 MG tablet Take 1 tablet by mouth 2 (two) times daily with a meal.  10/02/17   [provider]  pravastatin  (PRAVACHOL ) 20 MG tablet Take 1 tablet (20 mg total) by mouth every evening. 11/30/17 02/28/18  Hassan Links, MD  SUBOXONE 8-2 MG FILM Take 1 Film by mouth 2 (two) times daily.  03/03/14   [provider]  testosterone cypionate (DEPOTESTOTERONE CYPIONATE) 200 MG/ML injection Inject 200 mg into the muscle every 7 (seven) days.     [provider]  tiZANidine (ZANAFLEX) 4 MG capsule Take 4 mg by mouth 3 (three) times daily as needed for muscle spasms.  12/23/13   [provider]  TRULICITY 1.5 MG/0.5ML SOPN Inject 1.5 mg as directed once a week. 10/21/17   [provider]  venlafaxine XR (EFFEXOR-XR) 150 MG 24 hr capsule Take 150 mg by mouth at bedtime.  10/29/17   [provider]  VIAGRA 100 MG tablet Take 100 mg by mouth daily as needed for erectile dysfunction.  12/27/13   [provider]      Allergies    Patient has no known allergies.  Review of Systems   Review of Systems  Constitutional:  Negative for fever.  Respiratory:  Negative for shortness of breath.   Cardiovascular:  Positive for chest pain. Negative for leg swelling.  Gastrointestinal:  Negative for abdominal pain, nausea and vomiting.  All other systems reviewed and are negative.   Physical Exam Updated Vital Signs BP 132/73   Pulse 87   Temp 98.3 F (36.8 C)   Resp 11   Ht 1.778 m (5\' 10" )   Wt 111.1 kg   SpO2 100%   BMI 35.15 kg/m  Physical Exam Vitals and nursing note reviewed.  Constitutional:      Appearance: He is well-developed. He is not ill-appearing.  HENT:     Head: Normocephalic and atraumatic.  Eyes:      Pupils: Pupils are equal, round, and reactive to light.  Cardiovascular:     Rate and Rhythm: Normal rate and regular rhythm.     Heart sounds: Normal heart sounds. No murmur heard. Pulmonary:     Effort: Pulmonary effort is normal. No respiratory distress.     Breath sounds: Normal breath sounds. No wheezing.  Abdominal:     General: Bowel sounds are normal.     Palpations: Abdomen is soft.     Tenderness: There is no abdominal tenderness. There is no rebound.  Musculoskeletal:     Cervical back: Neck supple.  Lymphadenopathy:     Cervical: No cervical adenopathy.  Skin:    General: Skin is warm and dry.  Neurological:     General: No focal deficit present.     Mental Status: He is alert and oriented to person, place, and time.  Psychiatric:        Mood and Affect: Mood normal.     ED Results / Procedures / Treatments   Labs (all labs ordered are listed, but only abnormal results are displayed) Labs Reviewed  BASIC METABOLIC PANEL WITH GFR - Abnormal; Notable for the following components:      Result Value   Glucose, Bld 198 (*)    Calcium  8.6 (*)    All other components within normal limits  CBC - Abnormal; Notable for the following components:   WBC 12.7 (*)    Hemoglobin 17.3 (*)    All other components within normal limits  D-DIMER, QUANTITATIVE  TROPONIN I (HIGH SENSITIVITY)  TROPONIN I (HIGH SENSITIVITY)    EKG EKG Interpretation Date/Time:  Monday Dec 04 2023 04:43:56 EDT Ventricular Rate:  98 PR Interval:  190 QRS Duration:  140 QT Interval:  358 QTC Calculation: 457 R Axis:   124  Text Interpretation: Sinus rhythm with Premature atrial complexes Right bundle branch block Abnormal ECG When compared with ECG of 17-Nov-2017 00:52, PREVIOUS ECG IS PRESENT Right bundle branch block Confirmed by Donita Furrow ([id number]) on 12/04/2023 5:08:31 AM  Radiology DG Chest 2 View Result Date: 12/04/2023 CLINICAL DATA:  Chest pain. EXAM: CHEST - 2 VIEW COMPARISON:   11/16/2017 FINDINGS: The lungs are clear without focal pneumonia, edema, pneumothorax or pleural effusion. The cardiopericardial silhouette is within normal limits for size. No acute bony abnormality. IMPRESSION: No active cardiopulmonary disease. Electronically Signed   By: Donnal Fusi M.D.   On: 12/04/2023 05:54    Procedures Procedures    Medications Ordered in ED Medications  aspirin tablet 325 mg (has no administration in time range)    ED Course/ Medical Decision Making/ A&P  Medical Decision Making Amount and/or Complexity of Data Reviewed Labs: ordered. Radiology: ordered.  Risk OTC drugs.   This patient presents to the ED for concern of chest pain, this involves an extensive number of treatment options, and is a complaint that carries with it a high risk of complications and morbidity.  I considered the following differential and admission for this acute, potentially life threatening condition.  The differential diagnosis includes ACS, PE, pneumothorax, pneumonia  MDM:    This is a 58 year old male who presents with chest pain.  Fairly constant and ongoing for several weeks.  Atypical in nature but he does have risk factors.  EKG shows no evidence of acute ischemia or arrhythmia.  Troponin negative initially.  Screening D-dimer is negative and he is low risk for PE.  Chest x-ray shows no evidence of pneumothorax or pneumonia.  Will repeat troponin.  If negative, will have him follow-up with cardiology.  (Labs, imaging, consults)  Labs: I Ordered, and personally interpreted labs.  The pertinent results include: CBC, BMP, troponin, D-dimer  Imaging Studies ordered: I ordered imaging studies including chest x-ray I independently visualized and interpreted imaging. I agree with the radiologist interpretation  Additional history obtained from chart review.  External records from outside source obtained and reviewed including prior  evaluations  Cardiac Monitoring: The patient was maintained on a cardiac monitor.  If on the cardiac monitor, I personally viewed and interpreted the cardiac monitored which showed an underlying rhythm of: NS  Reevaluation: After the interventions noted above, I reevaluated the patient and found that they have :stayed the same  Social Determinants of Health:  lives independently  Disposition: Pending repeat troponin, signed out to oncoming provider  Co morbidities that complicate the patient evaluation  Past Medical History:  Diagnosis Date   Acute renal disease    Acute renal failure with tubular necrosis (HCC) 02/05/2016   AKI (acute kidney injury) (HCC) 02/05/2016   Arterial hypotension    Chest pain 03/19/2014   Chest pain    Diabetes (HCC)    Essential hypertension 03/19/2014   hypertension    Hyperkalemia    Hypertension    Hypotension    Lumbar disc disease    slipped disc   Sleep apnea    cpap broke   Slipped cervical disc    SOB (shortness of breath)    Tachycardia 07/18/2014   Type 2 diabetes mellitus (HCC) 07/18/2014     Medicines Meds ordered this encounter  Medications   aspirin tablet 325 mg    I have reviewed the patients home medicines and have made adjustments as needed  Problem List / ED Course: Problem List Items Addressed This Visit   None Visit Diagnoses       Atypical chest pain    -  Primary                   Final Clinical Impression(s) / ED Diagnoses Final diagnoses:  Atypical chest pain    Rx / DC Orders ED Discharge Orders     None         Rory Collard, MD 12/04/23 [phone]

## 2023-12-04 NOTE — ED Notes (Addendum)
 Pt in bed, no acute distress. Reports 7/10 chest pain. Denies any headache, dizziness, or nausea. Notes back pain but states that is chronic. Son is at bed side. MD notified.

## 2023-12-04 NOTE — ED Triage Notes (Signed)
 Complaining of chest pain for the last 2-3 weeks off and on. Tonight they have been worse. Felt like he was having problems catching his breath.

## 2023-12-04 NOTE — Discharge Instructions (Signed)

## 2024-03-26 ENCOUNTER — Ambulatory Visit: Attending: Cardiovascular Disease | Admitting: Cardiovascular Disease

## 2024-03-27 ENCOUNTER — Encounter: Payer: Self-pay | Admitting: Cardiovascular Disease
